# Patient Record
Sex: Female | Born: 1985 | Race: White | Hispanic: No | Marital: Married | Smoking: Never smoker
Health system: Southern US, Community
[De-identification: ages and names within clinical notes are randomized; demographics above are authoritative.]

## PROBLEM LIST (undated history)

## (undated) DIAGNOSIS — T8859XA Other complications of anesthesia, initial encounter: Secondary | ICD-10-CM

## (undated) DIAGNOSIS — K219 Gastro-esophageal reflux disease without esophagitis: Secondary | ICD-10-CM

## (undated) DIAGNOSIS — C801 Malignant (primary) neoplasm, unspecified: Secondary | ICD-10-CM

## (undated) DIAGNOSIS — F419 Anxiety disorder, unspecified: Secondary | ICD-10-CM

## (undated) DIAGNOSIS — Z8489 Family history of other specified conditions: Secondary | ICD-10-CM

## (undated) DIAGNOSIS — F329 Major depressive disorder, single episode, unspecified: Secondary | ICD-10-CM

## (undated) DIAGNOSIS — M549 Dorsalgia, unspecified: Secondary | ICD-10-CM

## (undated) DIAGNOSIS — M1990 Unspecified osteoarthritis, unspecified site: Secondary | ICD-10-CM

## (undated) DIAGNOSIS — I10 Essential (primary) hypertension: Secondary | ICD-10-CM

## (undated) DIAGNOSIS — G2581 Restless legs syndrome: Secondary | ICD-10-CM

## (undated) DIAGNOSIS — M810 Age-related osteoporosis without current pathological fracture: Secondary | ICD-10-CM

## (undated) DIAGNOSIS — F32A Depression, unspecified: Secondary | ICD-10-CM

## (undated) HISTORY — PX: BREAST SURGERY: SHX581

## (undated) HISTORY — PX: LEG SURGERY: SHX1003

## (undated) HISTORY — PX: BACK SURGERY: SHX140

---
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
## 2010-01-02 HISTORY — PX: BACK SURGERY: SHX140

## 2012-01-03 HISTORY — PX: BREAST SURGERY: SHX581

## 2015-11-26 DIAGNOSIS — F419 Anxiety disorder, unspecified: Secondary | ICD-10-CM | POA: Insufficient documentation

## 2017-12-03 ENCOUNTER — Encounter (HOSPITAL_COMMUNITY): Payer: Self-pay | Admitting: *Deleted

## 2017-12-03 ENCOUNTER — Emergency Department (HOSPITAL_COMMUNITY)
Admission: EM | Admit: 2017-12-03 | Discharge: 2017-12-03 | Disposition: A | Discharge status: Home / Self Care | Payer: Medicaid - Out of State | Attending: Emergency Medicine | Admitting: Emergency Medicine

## 2017-12-03 DIAGNOSIS — Y929 Unspecified place or not applicable: Secondary | ICD-10-CM | POA: Insufficient documentation

## 2017-12-03 DIAGNOSIS — S61011A Laceration without foreign body of right thumb without damage to nail, initial encounter: Secondary | ICD-10-CM

## 2017-12-03 DIAGNOSIS — Y93G1 Activity, food preparation and clean up: Secondary | ICD-10-CM | POA: Diagnosis not present

## 2017-12-03 DIAGNOSIS — Z23 Encounter for immunization: Secondary | ICD-10-CM | POA: Diagnosis not present

## 2017-12-03 DIAGNOSIS — W25XXXA Contact with sharp glass, initial encounter: Secondary | ICD-10-CM | POA: Insufficient documentation

## 2017-12-03 DIAGNOSIS — Y999 Unspecified external cause status: Secondary | ICD-10-CM | POA: Insufficient documentation

## 2017-12-03 MED ORDER — LIDOCAINE HCL (PF) 1 % IJ SOLN
5.0000 mL | Freq: Once | INTRAMUSCULAR | Status: AC
  Administered 2017-12-03: 5 mL via INTRADERMAL
  Filled 2017-12-03: qty 5

## 2017-12-03 MED ORDER — TETANUS-DIPHTH-ACELL PERTUSSIS 5-2.5-18.5 LF-MCG/0.5 IM SUSP
0.5000 mL | Freq: Once | INTRAMUSCULAR | Status: AC
  Administered 2017-12-03: 0.5 mL via INTRAMUSCULAR
  Filled 2017-12-03: qty 0.5

## 2017-12-03 NOTE — ED Provider Notes (Signed)
[facility] EMERGENCY DEPARTMENT Provider Note   CSN: [id number] Arrival date & time: 12/03/17  0015     History   Chief Complaint Chief Complaint  Patient presents with  . Laceration    HPI Rachel Wilkerson is a 32 y.o. female.  The history is provided by the patient and medical records.  Laceration       32 y.o. F here with right thumb laceration.  Patient reports she was washing dishes and a glass broke and cut her right thumb.  States bleeding was significant, applied dressing at home and came here.  Bleeding is controlled on arrival.  She is right hand dominant.  Tetanus is UTD.  History reviewed. No pertinent past medical history.  There are no active problems to display for this patient.   History reviewed. No pertinent surgical history.   OB History   None      Home Medications    Prior to Admission medications   Not on File    Family History No family history on file.  Social History Social History   Tobacco Use  . Smoking status: Not on file  Substance Use Topics  . Alcohol use: Not on file  . Drug use: Not on file     Allergies   Erythromycin and Tramadol   Review of Systems Review of Systems  Skin: Positive for wound.  All other systems reviewed and are negative.    Physical Exam Updated Vital Signs BP (!) 176/97 (BP Location: Left Arm)   Pulse 91   Temp 98.2 F (36.8 C) (Oral)   Resp 14   SpO2 98%   Physical Exam  Constitutional: She is oriented to person, place, and time. She appears well-developed and well-nourished.  HENT:  Head: Normocephalic and atraumatic.  Mouth/Throat: Oropharynx is clear and moist.  Eyes: Pupils are equal, round, and reactive to light. Conjunctivae and EOM are normal.  Neck: Normal range of motion.  Cardiovascular: Normal rate, regular rhythm and normal heart sounds.  Pulmonary/Chest: Effort normal and breath sounds normal. No stridor. No respiratory distress.  Abdominal:  Soft. Bowel sounds are normal. There is no tenderness. There is no rebound.  Musculoskeletal: Normal range of motion.  V shaped laceration of dorsal aspect right thumb, bleeding controlled; laceration crossed the IP joint but maintains normal flexion/extension, no deep tissue, vessel, or tendon involvement noted, no bony deformities; normal distal sensation and cap refill  Neurological: She is alert and oriented to person, place, and time.  Skin: Skin is warm and dry.  Psychiatric: She has a normal mood and affect.  Nursing note and vitals reviewed.    ED Treatments / Results  Labs (all labs ordered are listed, but only abnormal results are displayed) Labs Reviewed - No data to display  EKG None  Radiology No results found.  Procedures Procedures (including critical care time)  LACERATION REPAIR Performed by: Larene Pickett Authorized by: Larene Pickett Consent: Verbal consent obtained. Risks and benefits: risks, benefits and alternatives were discussed Consent given by: patient Patient identity confirmed: provided demographic data Prepped and Draped in normal sterile fashion Wound explored  Laceration Location: right thumb  Laceration Length: 4cm, V-shaped  No Foreign Bodies seen or palpated  Anesthesia: local infiltration  Local anesthetic: lidocaine 1% without epinephrine  Anesthetic total: 4 ml  Irrigation method: syringe Amount of cleaning: standard  Skin closure: 4-0 prolene  Number of sutures: 4  Technique: simple interrupted  Patient tolerance: Patient tolerated  the procedure well with no immediate complications.   Medications Ordered in ED Medications  lidocaine (PF) (XYLOCAINE) 1 % injection 5 mL (5 mLs Intradermal Given 12/03/17 0027)  Tdap (BOOSTRIX) injection 0.5 mL (0.5 mLs Intramuscular Given 12/03/17 0026)     Initial Impression / Assessment and Plan / ED Course  I have reviewed the triage vital signs and the nursing notes.  Pertinent  labs & imaging results that were available during my care of the patient were reviewed by me and considered in my medical decision making (see chart for details).  32 y.o. F here with laceration to right thumb which she sustained when glass broke while doing dishes.  Bleeding controlled on arrival.  She has 4 cm V-shaped laceration to right dorsal thumb.  This does cross the IP joint but there is no deep tissue, vessel, or tendon involvement.  Tetanus was updated.  Laceration repaired as above, patient tolerated well.  Discussed home wound care.  Close follow-up with urgent care for suture removal in 7 to 10 days.  She will return here for any new or worsening symptoms.  Final Clinical Impressions(s) / ED Diagnoses   Final diagnoses:  Laceration of right thumb without foreign body without damage to nail, initial encounter    ED Discharge Orders    None       Larene Pickett, PA-C 12/03/17 0224    Orpah Greek, MD 12/03/17 [phone]

## 2017-12-03 NOTE — Discharge Instructions (Signed)
Keep sutures clean and dry. Follow-up with urgent care for suture removal in 7-10 days. Return to the ED for new or worsening symptoms.

## 2017-12-03 NOTE — ED Triage Notes (Signed)
To ED for eval of right thumb lac. Pt was washing dishes and there was a broken glass in the sink. Sensation wnl to thumb. Unknown last tentnus

## 2017-12-08 ENCOUNTER — Emergency Department (HOSPITAL_COMMUNITY)
Admission: EM | Admit: 2017-12-08 | Discharge: 2017-12-08 | Disposition: A | Discharge status: Home / Self Care | Payer: Medicaid - Out of State | Attending: Emergency Medicine | Admitting: Emergency Medicine

## 2017-12-08 ENCOUNTER — Encounter (HOSPITAL_COMMUNITY): Payer: Self-pay | Admitting: Emergency Medicine

## 2017-12-08 ENCOUNTER — Other Ambulatory Visit: Payer: Self-pay

## 2017-12-08 ENCOUNTER — Emergency Department (HOSPITAL_COMMUNITY): Payer: Medicaid - Out of State

## 2017-12-08 DIAGNOSIS — L089 Local infection of the skin and subcutaneous tissue, unspecified: Secondary | ICD-10-CM | POA: Insufficient documentation

## 2017-12-08 DIAGNOSIS — I10 Essential (primary) hypertension: Secondary | ICD-10-CM | POA: Insufficient documentation

## 2017-12-08 DIAGNOSIS — W25XXXD Contact with sharp glass, subsequent encounter: Secondary | ICD-10-CM | POA: Insufficient documentation

## 2017-12-08 DIAGNOSIS — R51 Headache: Secondary | ICD-10-CM | POA: Diagnosis not present

## 2017-12-08 DIAGNOSIS — S61011D Laceration without foreign body of right thumb without damage to nail, subsequent encounter: Secondary | ICD-10-CM | POA: Diagnosis present

## 2017-12-08 DIAGNOSIS — S61011A Laceration without foreign body of right thumb without damage to nail, initial encounter: Secondary | ICD-10-CM

## 2017-12-08 LAB — CBC WITH DIFFERENTIAL/PLATELET
Abs Immature Granulocytes: 0.02 10*3/uL (ref 0.00–0.07)
Basophils Absolute: 0 10*3/uL (ref 0.0–0.1)
Basophils Relative: 0 %
Eosinophils Absolute: 0.1 10*3/uL (ref 0.0–0.5)
Eosinophils Relative: 1 %
HCT: 38.6 % (ref 36.0–46.0)
Hemoglobin: 12.6 g/dL (ref 12.0–15.0)
Immature Granulocytes: 0 %
Lymphocytes Relative: 26 %
Lymphs Abs: 2.6 10*3/uL (ref 0.7–4.0)
MCH: 27.5 pg (ref 26.0–34.0)
MCHC: 32.6 g/dL (ref 30.0–36.0)
MCV: 84.1 fL (ref 80.0–100.0)
Monocytes Absolute: 0.7 10*3/uL (ref 0.1–1.0)
Monocytes Relative: 7 %
Neutro Abs: 6.6 10*3/uL (ref 1.7–7.7)
Neutrophils Relative %: 66 %
Platelets: 329 10*3/uL (ref 150–400)
RBC: 4.59 MIL/uL (ref 3.87–5.11)
RDW: 13 % (ref 11.5–15.5)
WBC: 10 10*3/uL (ref 4.0–10.5)
nRBC: 0 % (ref 0.0–0.2)

## 2017-12-08 LAB — HCG, QUANTITATIVE, PREGNANCY: hCG, Beta Chain, Quant, S: <1 m[IU]/mL (ref <5)

## 2017-12-08 LAB — BASIC METABOLIC PANEL
Anion gap: 11 (ref 5–15)
BUN: 15 mg/dL (ref 6–20)
CO2: 24 mmol/L (ref 22–32)
Calcium: 9.4 mg/dL (ref 8.9–10.3)
Chloride: 100 mmol/L (ref 98–111)
Creatinine, Ser: 1.1 mg/dL — ABNORMAL HIGH (ref 0.44–1.00)
GFR calc Af Amer: >60 mL/min (ref >60)
GFR calc non Af Amer: >60 mL/min (ref >60)
Glucose, Bld: 95 mg/dL (ref 70–99)
Potassium: 3.7 mmol/L (ref 3.5–5.1)
Sodium: 135 mmol/L (ref 135–145)

## 2017-12-08 MED ORDER — KETOROLAC TROMETHAMINE 30 MG/ML IJ SOLN
30.0000 mg | Freq: Once | INTRAMUSCULAR | Status: AC
  Administered 2017-12-08: 30 mg via INTRAVENOUS
  Filled 2017-12-08: qty 1

## 2017-12-08 MED ORDER — CEPHALEXIN 250 MG PO CAPS
500.0000 mg | ORAL_CAPSULE | Freq: Once | ORAL | Status: AC
  Administered 2017-12-08: 500 mg via ORAL
  Filled 2017-12-08: qty 2

## 2017-12-08 MED ORDER — DIPHENHYDRAMINE HCL 50 MG/ML IJ SOLN
25.0000 mg | Freq: Once | INTRAMUSCULAR | Status: AC
  Administered 2017-12-08: 25 mg via INTRAVENOUS
  Filled 2017-12-08: qty 1

## 2017-12-08 MED ORDER — PROCHLORPERAZINE EDISYLATE 10 MG/2ML IJ SOLN
10.0000 mg | Freq: Once | INTRAMUSCULAR | Status: AC
  Administered 2017-12-08: 10 mg via INTRAVENOUS
  Filled 2017-12-08: qty 2

## 2017-12-08 MED ORDER — SODIUM CHLORIDE 0.9 % IV BOLUS
1000.0000 mL | Freq: Once | INTRAVENOUS | Status: AC
  Administered 2017-12-08: 1000 mL via INTRAVENOUS

## 2017-12-08 MED ORDER — CEPHALEXIN 500 MG PO CAPS: 500.0000 mg | ORAL_CAPSULE | Freq: Four times a day (QID) | ORAL | 0 refills | Status: AC

## 2017-12-08 NOTE — Discharge Instructions (Addendum)
Please take all of your antibiotics until finished!   You may develop abdominal discomfort or diarrhea from the antibiotic.  You may help offset this with probiotics which you can buy or get in yogurt. Do not eat  or take the probiotics until 2 hours after your antibiotic.   Alternate 600 mg of ibuprofen and [phone] mg of Tylenol every 3-4 hours as needed for pain. Do not exceed 4000 mg of Tylenol daily.  Take ibuprofen with food to avoid upset stomach issues.  Keep your wound clean and dry.  Change the dressing once daily.  Follow-up with hand surgery for reevaluation of your symptoms and wound check in the next 48 hours.  If you are unable to get an appointment with a hand surgeon for reevaluation in 48 hours you can go to the urgent care or return to the ED for wound check.  If your blood pressure (BP) was elevated on multiple readings during this visit above 130 for the top number or above 80 for the bottom number, please have this repeated by your primary care provider within one month. You can also check your blood pressure when you are out at a pharmacy or grocery store. Many have machines that will check your blood pressure.  If your blood pressure remains elevated, please follow-up with your PCP.  Return to the emergency department sooner if any concerning signs or symptoms develop such as fevers, streaking of redness up the arm, severe swelling, vomiting, altered mental status, or passing out.

## 2017-12-08 NOTE — ED Provider Notes (Signed)
[facility] EMERGENCY DEPARTMENT Provider Note   CSN: [id number] Arrival date & time: 12/08/17  1338     History   Chief Complaint Chief Complaint  Patient presents with  . thumb infection    HPI Rachel Wilkerson is a 32 y.o. female with history of hypertension and migraines presents for evaluation of acute onset, progressively worsening erythema and drainage from a wound beginning 2 days ago.  She was seen and evaluated in the ED earlier on 12/03/2017 for a right thumb wound sustained earlier the day before washing dishes and a glass broke.  She was neurovascularly intact the wound was thoroughly irrigated and repaired which she tolerated without difficulty.  She reports that the wound had been healing "just fine" up until she did dishes 2 days ago.  She reports she woke up yesterday with erythema and swelling and when she bent her right thumb she "busted open 1 of the sutures and some yellowish-white fluid came out ".  She reports that the erythema has spread since then.  She denies fevers, diaphoresis, or chills.  She does not believe there is any retained glass.  She does note a small patch of numbness distal to the laceration repair and some tingling proximal to the laceration repair but otherwise has intact sensation.  When asked about her blood pressure, she reports that she has a known history of hypertension and takes metoprolol for this every night.  She reports that when she is compliant with her medications her blood pressure is "pretty well controlled ".  She has not been checking her blood pressure recently but does note that she has had increasing severity of her usual migraine headaches this week.  She reports that she has had a headache almost every day, localized to the frontal region and throbbing.  She does note associated photophobia and phonophobia and nausea.  She denies vision changes, slurred speech, syncope, chest pain, shortness of breath, or abdominal  pain.  Denies head injury.  The history is provided by the patient.    History reviewed. No pertinent past medical history.  There are no active problems to display for this patient.   History reviewed. No pertinent surgical history.   OB History   None      Home Medications    Prior to Admission medications   Medication Sig Start Date End Date Taking? Authorizing Provider  cephALEXin (KEFLEX) 500 MG capsule Take 1 capsule (500 mg total) by mouth 4 (four) times daily for 7 days. 12/08/17 12/15/17  Renita Papa, PA-C    Family History No family history on file.  Social History Social History   Tobacco Use  . Smoking status: Not on file  Substance Use Topics  . Alcohol use: Not on file  . Drug use: Not on file     Allergies   Erythromycin and Tramadol   Review of Systems Review of Systems  Constitutional: Negative for chills and fever.  Respiratory: Negative for shortness of breath.   Cardiovascular: Negative for chest pain.  Gastrointestinal: Positive for nausea. Negative for abdominal pain and vomiting.  Skin: Positive for color change and wound.  Neurological: Positive for headaches. Negative for syncope, weakness and numbness.  All other systems reviewed and are negative.    Physical Exam Updated Vital Signs BP (!) 153/88 (BP Location: Left Arm)   Pulse 92   Temp 98.3 F (36.8 C) (Oral)   Resp 20   SpO2 100%   Physical Exam  Constitutional: She appears well-developed and well-nourished. No distress.  HENT:  Head: Normocephalic and atraumatic.  Eyes: Conjunctivae are normal. Right eye exhibits no discharge. Left eye exhibits no discharge.  Neck: No JVD present. No tracheal deviation present.  Cardiovascular: Normal rate, regular rhythm and intact distal pulses.  Pulmonary/Chest: Effort normal and breath sounds normal.  Abdominal: Soft. Bowel sounds are normal. She exhibits no distension. There is no tenderness. There is no guarding.    Musculoskeletal: Normal range of motion. She exhibits no edema.  See below image.  Wound to the radial aspect of the right thumb, 4 imple interrupted sutures still in place.  Oozing purulent fluid.  There is some surrounding erythema and mild tenderness to palpation.  Normal active range of motion.  5/5 strength of BUE major muscle groups.  No crepitus.  Neurological: She is alert.  Mental Status:  Alert, thought content appropriate, able to give a coherent history. Speech fluent without evidence of aphasia. Able to follow 2 step commands without difficulty.  Cranial Nerves:  II:  Peripheral visual fields grossly normal, pupils equal, round, reactive to light III,IV, VI: ptosis not present, extra-ocular motions intact bilaterally  V,VII: smile symmetric, facial light touch sensation equal VIII: hearing grossly normal to voice  X: uvula elevates symmetrically  XI: bilateral shoulder shrug symmetric and strong XII: midline tongue extension without fassiculations Motor:  Normal tone. 5/5 strength of BUE and BLE major muscle groups including strong and equal grip strength and dorsiflexion/plantar flexion Sensory: light touch normal in all extremities. Cerebellar: normal finger-to-nose with bilateral upper extremities Gait: normal gait and balance. Able to walk on toes and heels with ease.  Romberg sign absent.  No pronator drift.  No nystagmus per  Skin: Skin is warm and dry. Capillary refill takes less than 2 seconds. There is erythema.  Psychiatric: She has a normal mood and affect. Her behavior is normal.  Nursing note and vitals reviewed.      ED Treatments / Results  Labs (all labs ordered are listed, but only abnormal results are displayed) Labs Reviewed  CBC WITH DIFFERENTIAL/PLATELET  BASIC METABOLIC PANEL  HCG, QUANTITATIVE, PREGNANCY    EKG None  Radiology Ct Head Wo Contrast  Result Date: 12/08/2017 CLINICAL DATA:  31 year old female with diffuse headache,  hypertension and a history of Hodgkin's lymphoma EXAM: CT HEAD WITHOUT CONTRAST TECHNIQUE: Contiguous axial images were obtained from the base of the skull through the vertex without intravenous contrast. COMPARISON:  None. FINDINGS: Brain: No evidence of acute infarction, hemorrhage, hydrocephalus, extra-axial collection or mass lesion/mass effect. Vascular: No hyperdense vessel or unexpected calcification. Skull: Normal. Negative for fracture or focal lesion. Sinuses/Orbits: No acute finding. Other: None. IMPRESSION: Negative head CT. Electronically Signed   By: Jacqulynn Cadet M.D.   On: 12/08/2017 14:44    Procedures .Suture Removal Date/Time: 12/08/2017 3:12 PM Performed by: Renita Papa, PA-C Authorized by: Renita Papa, PA-C   Consent:    Consent obtained:  Verbal   Consent given by:  Patient   Risks discussed:  Bleeding, pain and wound separation   Alternatives discussed:  No treatment and delayed treatment Location:    Location:  Upper extremity   Upper extremity location:  Hand   Hand location:  R thumb Procedure details:    Wound appearance:  Red, draining and tender   Drainage characteristics:  Purrulent    Number of sutures removed:  4 Post-procedure details:    Post-removal:  Dressing applied   Patient tolerance  of procedure:  Tolerated well, no immediate complications Comments:     Splint for protection.    (including critical care time)  Medications Ordered in ED Medications  cephALEXin (KEFLEX) capsule 500 mg (has no administration in time range)  sodium chloride 0.9 % bolus 1,000 mL (1,000 mLs Intravenous New Bag/Given 12/08/17 1444)  prochlorperazine (COMPAZINE) injection 10 mg (10 mg Intravenous Given 12/08/17 1445)  diphenhydrAMINE (BENADRYL) injection 25 mg (25 mg Intravenous Given 12/08/17 1445)  ketorolac (TORADOL) 30 MG/ML injection 30 mg (30 mg Intravenous Given 12/08/17 1445)     Initial Impression / Assessment and Plan / ED Course  I have reviewed  the triage vital signs and the nursing notes.  Pertinent labs & imaging results that were available during my care of the patient were reviewed by me and considered in my medical decision making (see chart for details).     Patient presenting for evaluation of infection to laceration sustained 6 days ago.  Patient is afebrile, hypertensive in the ED.  She was also hypertensive at her visit on 12/03/2017.  She reports she has been compliant with her blood pressure medications but has been having increasing severity of her usual migraine headaches this week.  No focal neurologic deficits on examination.  Will obtain lab work and head CT to rule out endorgan damage, CVA, or hypertensive emergency.  We will give migraine cocktail for management of symptoms.  With regards to her right thumb laceration, there does appear to be signs of infection.  We discussed the risks and benefits of suture removal and she wished to proceed with suture removal at this time.  There is mild dehiscence after removal.  The wound was cleaned and a splint for protection was applied.  We will obtain radiographs to rule out subcutaneous gas, retained foreign body, or osseous abnormality.  She is neurovascularly intact, no evidence of tendon disruption or ligamentous injury.  She is nontoxic and nonseptic in appearance.  3:14 PM Head CT without acute intracranial abnormality.  Patient was just given the migraine cocktail.  Signed out to oncoming provider PA Percell Miller.  Awaiting x-ray and reassessment.  If patient's headache and blood pressure have improved and there is no evidence of concerning abnormality on x-ray she will be stable for discharge home with follow-up with PCP for reevaluation of hypertension and possible medication management.  Recommend follow-up with hand surgery in the next 2 days for reassessment.  Will discharge with a course of Keflex.  She is unable to follow-up with the hand surgeon in the next 48 hours, she can  return to the ED or urgent care for wound check.  I have discussed indications for return to the ED and the patient and her daughter have verbalized understanding of and agreement with the preemptive plan barring any areas to safe discharge.  Final Clinical Impressions(s) / ED Diagnoses   Final diagnoses:  Laceration of right thumb with infection, initial encounter  Hypertension, unspecified type    ED Discharge Orders         Ordered    cephALEXin (KEFLEX) 500 MG capsule  4 times daily     12/08/17 1517           Renita Papa, PA-C 12/08/17 1517    Dorie Rank, MD 12/08/17 1902

## 2017-12-08 NOTE — ED Notes (Signed)
Patient verbalizes understanding of discharge instructions. Opportunity for questioning and answers were provided. 

## 2017-12-08 NOTE — ED Provider Notes (Signed)
Assumed care of patient at change of shift. Briefly, laceration to the thumb 12/03/17, closed with sutures. CT head and labs due to elevated blood pressure and headache, CT head normal, labs pending. Patient to wound check in 2 days with ER/UC or hand.  Physical Exam  BP (!) 153/88 (BP Location: Left Arm)   Pulse 92   Temp 98.3 F (36.8 C) (Oral)   Resp 20   SpO2 100%   Physical Exam  ED Course/Procedures     Procedures  MDM  XR thumb unremarkable, labs WNL. Patient is feeling better and ready for dc home.        Tacy Learn, PA-C 12/08/17 1613    Dorie Rank, MD 12/08/17 [phone]

## 2017-12-08 NOTE — ED Triage Notes (Signed)
Pt was here 12/2 for thumb laceration and had stitches. 2 days ago pt washed dishes and now the suture sight is red with drainage coming out. Pt complaining of throbbing pain at sight and on hand

## 2017-12-17 ENCOUNTER — Encounter (HOSPITAL_COMMUNITY): Payer: Self-pay

## 2017-12-17 ENCOUNTER — Emergency Department (HOSPITAL_COMMUNITY)
Admission: EM | Admit: 2017-12-17 | Discharge: 2017-12-18 | Disposition: A | Discharge status: Home / Self Care | Payer: Medicaid - Out of State | Attending: Emergency Medicine | Admitting: Emergency Medicine

## 2017-12-17 ENCOUNTER — Other Ambulatory Visit: Payer: Self-pay

## 2017-12-17 DIAGNOSIS — Z5321 Procedure and treatment not carried out due to patient leaving prior to being seen by health care provider: Secondary | ICD-10-CM | POA: Insufficient documentation

## 2017-12-17 DIAGNOSIS — R51 Headache: Secondary | ICD-10-CM | POA: Diagnosis present

## 2017-12-17 HISTORY — DX: Dorsalgia, unspecified: M54.9

## 2017-12-17 HISTORY — DX: Major depressive disorder, single episode, unspecified: F32.9

## 2017-12-17 HISTORY — DX: Essential (primary) hypertension: I10

## 2017-12-17 HISTORY — DX: Depression, unspecified: F32.A

## 2017-12-17 HISTORY — DX: Anxiety disorder, unspecified: F41.9

## 2017-12-17 HISTORY — DX: Malignant (primary) neoplasm, unspecified: C80.1

## 2017-12-17 HISTORY — DX: Gastro-esophageal reflux disease without esophagitis: K21.9

## 2017-12-17 LAB — I-STAT BETA HCG BLOOD, ED (MC, WL, AP ONLY): I-stat hCG, quantitative: <5 m[IU]/mL (ref <5)

## 2017-12-17 LAB — COMPREHENSIVE METABOLIC PANEL
ALT: 27 U/L (ref 0–44)
AST: 19 U/L (ref 15–41)
Albumin: 4.2 g/dL (ref 3.5–5.0)
Alkaline Phosphatase: 52 U/L (ref 38–126)
Anion gap: 11 (ref 5–15)
BUN: 11 mg/dL (ref 6–20)
CALCIUM: 9.7 mg/dL (ref 8.9–10.3)
CO2: 26 mmol/L (ref 22–32)
CREATININE: 1.02 mg/dL — AB (ref 0.44–1.00)
Chloride: 101 mmol/L (ref 98–111)
GFR calc Af Amer: >60 mL/min (ref >60)
GFR calc non Af Amer: >60 mL/min (ref >60)
Glucose, Bld: 100 mg/dL — ABNORMAL HIGH (ref 70–99)
Potassium: 3.7 mmol/L (ref 3.5–5.1)
Sodium: 138 mmol/L (ref 135–145)
Total Bilirubin: 0.9 mg/dL (ref 0.3–1.2)
Total Protein: 7.7 g/dL (ref 6.5–8.1)

## 2017-12-17 LAB — CBC
HCT: 36.3 % (ref 36.0–46.0)
Hemoglobin: 12.1 g/dL (ref 12.0–15.0)
MCH: 27.9 pg (ref 26.0–34.0)
MCHC: 33.3 g/dL (ref 30.0–36.0)
MCV: 83.6 fL (ref 80.0–100.0)
PLATELETS: 284 10*3/uL (ref 150–400)
RBC: 4.34 MIL/uL (ref 3.87–5.11)
RDW: 13 % (ref 11.5–15.5)
WBC: 10.4 10*3/uL (ref 4.0–10.5)
nRBC: 0 % (ref 0.0–0.2)

## 2017-12-17 LAB — LIPASE, BLOOD: Lipase: 28 U/L (ref 11–51)

## 2017-12-17 MED ORDER — ONDANSETRON 4 MG PO TBDP
4.0000 mg | ORAL_TABLET | Freq: Once | ORAL | Status: AC | PRN
  Administered 2017-12-17: 4 mg via ORAL
  Filled 2017-12-17: qty 1

## 2017-12-17 MED ORDER — ACETAMINOPHEN 325 MG PO TABS
650.0000 mg | ORAL_TABLET | Freq: Once | ORAL | Status: AC | PRN
  Administered 2017-12-17: 650 mg via ORAL
  Filled 2017-12-17: qty 2

## 2017-12-17 NOTE — ED Triage Notes (Signed)
Pt reports headache for the past 2 weeks that has gotten worse with dizziness that started on the way here. Pt now c/o nausea that started today, no vomiting.

## 2017-12-18 ENCOUNTER — Emergency Department (HOSPITAL_COMMUNITY)
Admission: EM | Admit: 2017-12-18 | Discharge: 2017-12-18 | Disposition: A | Discharge status: Home / Self Care | Payer: Medicaid - Out of State | Source: Home / Self Care

## 2017-12-18 DIAGNOSIS — R079 Chest pain, unspecified: Secondary | ICD-10-CM

## 2017-12-18 DIAGNOSIS — Z5321 Procedure and treatment not carried out due to patient leaving prior to being seen by health care provider: Secondary | ICD-10-CM | POA: Insufficient documentation

## 2017-12-18 NOTE — ED Triage Notes (Signed)
Pt left AMA, called EMS from home w/ family.  Hx of HNT (off medications), cancer.  Per EMS, called for headache, chest pain w/ deep breath, clear lung sounds.  Emesis w/ EMS.  Gave 3 nitro, 324 ASA, 4 Zofran. 20 G in Left wrist.  Sinus Tac at 133.  CBG 102.    Note: AS RN was getting report from EMS, pt walked out of the triage room telling NT that she was leaving b/c she didn't want to wait.

## 2018-01-13 ENCOUNTER — Encounter (HOSPITAL_COMMUNITY): Payer: Self-pay | Admitting: Emergency Medicine

## 2018-01-13 ENCOUNTER — Other Ambulatory Visit: Payer: Self-pay

## 2018-01-13 ENCOUNTER — Emergency Department (HOSPITAL_COMMUNITY)
Admission: EM | Admit: 2018-01-13 | Discharge: 2018-01-13 | Disposition: A | Discharge status: Home / Self Care | Payer: Medicaid - Out of State | Attending: Emergency Medicine | Admitting: Emergency Medicine

## 2018-01-13 DIAGNOSIS — Z9119 Patient's noncompliance with other medical treatment and regimen: Secondary | ICD-10-CM | POA: Diagnosis not present

## 2018-01-13 DIAGNOSIS — I10 Essential (primary) hypertension: Secondary | ICD-10-CM | POA: Diagnosis not present

## 2018-01-13 DIAGNOSIS — R0981 Nasal congestion: Secondary | ICD-10-CM | POA: Diagnosis present

## 2018-01-13 DIAGNOSIS — H6121 Impacted cerumen, right ear: Secondary | ICD-10-CM | POA: Insufficient documentation

## 2018-01-13 DIAGNOSIS — Z8571 Personal history of Hodgkin lymphoma: Secondary | ICD-10-CM | POA: Insufficient documentation

## 2018-01-13 DIAGNOSIS — J0100 Acute maxillary sinusitis, unspecified: Secondary | ICD-10-CM | POA: Diagnosis not present

## 2018-01-13 MED ORDER — ATENOLOL 25 MG PO TABS: 25.0000 mg | ORAL_TABLET | Freq: Every day | ORAL | 0 refills | Status: DC

## 2018-01-13 MED ORDER — AMOXICILLIN-POT CLAVULANATE 875-125 MG PO TABS: 1.0000 | ORAL_TABLET | Freq: Two times a day (BID) | ORAL | 0 refills | Status: DC

## 2018-01-13 NOTE — ED Provider Notes (Signed)
[facility] Provider Note   CSN: [id number] Arrival date & time: 01/13/18  1600     History   Chief Complaint Chief Complaint  Patient presents with  . Facial Pain  . Nasal Congestion    HPI Rachel Wilkerson is a 33 y.o. female who presents with sinus congestion.  Past medical history significant for chronic back pain, history of Hodgkin's lymphoma currently in remission, anxiety/depression, GERD, hypertension.  Patient states that she has had sinus congestion for the past week and a half.  She also has a pressure behind her eyes and a headache.  She has been taking Tylenol cold and sinus and using Flonase without significant relief.  She also notes that she has been off her blood pressure medications for several weeks.  She is not able to get a refill until 23 January and her primary care provider is in [place].  She denies fever, ear pain, runny nose, sore throat, chest pain, shortness of breath, palpitations, lightheadedness or syncope.  HPI  Past Medical History:  Diagnosis Date  . Anxiety   . Back pain   . Cancer ([facility])    hodkings lymphoma 2016  . Depression   . GERD (gastroesophageal reflux disease)   . Hypertension     There are no active problems to display for this patient.   Past Surgical History:  Procedure Laterality Date  . BACK SURGERY    . BREAST SURGERY    . LEG SURGERY       OB History   No obstetric history on file.      Home Medications    Prior to Admission medications   Medication Sig Start Date End Date Taking? Authorizing Provider  amoxicillin-clavulanate (AUGMENTIN) 875-125 MG tablet Take 1 tablet by mouth 2 (two) times daily. 01/13/18   Recardo Evangelist, PA-C  atenolol (TENORMIN) 25 MG tablet Take 1 tablet (25 mg total) by mouth daily. 01/13/18   Recardo Evangelist, PA-C    Family History No family history on file.  Social History Social History   Tobacco Use  . Smoking status: Never  Smoker  . Smokeless tobacco: Never Used  Substance Use Topics  . Alcohol use: Not Currently  . Drug use: Never     Allergies   Erythromycin and Tramadol   Review of Systems Review of Systems  Constitutional: Negative for chills and fever.  HENT: Positive for congestion and sinus pressure. Negative for ear pain, rhinorrhea and sore throat.   Respiratory: Negative for shortness of breath.   Cardiovascular: Negative for chest pain.  Gastrointestinal: Negative for abdominal pain.  Neurological: Positive for headaches. Negative for dizziness and syncope.  All other systems reviewed and are negative.    Physical Exam Updated Vital Signs BP (!) 156/114 (BP Location: Right Arm)   Pulse (!) 104   Temp 98.5 F (36.9 C) (Oral)   Resp 16   Ht 5\' 4"  (1.626 m)   Wt 88 kg   LMP 01/06/2018   SpO2 100%   BMI 33.30 kg/m   Physical Exam Vitals signs and nursing note reviewed.  Constitutional:      General: She is not in acute distress.    Appearance: Normal appearance. She is well-developed. She is obese.     Comments: Calm, cooperative.  Sounds congested.  HENT:     Head: Normocephalic and atraumatic.     Right Ear: There is impacted cerumen.     Left Ear: Hearing, tympanic membrane,  ear canal and external ear normal.     Nose:     Right Sinus: Maxillary sinus tenderness and frontal sinus tenderness present.     Left Sinus: Maxillary sinus tenderness and frontal sinus tenderness present.     Mouth/Throat:     Lips: Pink.     Mouth: Mucous membranes are moist.     Pharynx: Oropharynx is clear.  Eyes:     General: No scleral icterus.       Right eye: No discharge.        Left eye: No discharge.     Conjunctiva/sclera: Conjunctivae normal.     Pupils: Pupils are equal, round, and reactive to light.  Neck:     Musculoskeletal: Normal range of motion.  Cardiovascular:     Rate and Rhythm: Tachycardia present.  Pulmonary:     Effort: Pulmonary effort is normal. No  respiratory distress.     Breath sounds: Normal breath sounds.  Abdominal:     General: There is no distension.  Skin:    General: Skin is warm and dry.  Neurological:     Mental Status: She is alert and oriented to person, place, and time.  Psychiatric:        Behavior: Behavior normal.      ED Treatments / Results  Labs (all labs ordered are listed, but only abnormal results are displayed) Labs Reviewed - No data to display  EKG None  Radiology No results found.  Procedures Procedures (including critical care time)  Medications Ordered in ED Medications - No data to display   Initial Impression / Assessment and Plan / ED Course  I have reviewed the triage vital signs and the nursing notes.  Pertinent labs & imaging results that were available during my care of the patient were reviewed by me and considered in my medical decision making (see chart for details).  33 year old female presents with sinus congestion and a headache.  She notes that she has been off blood pressure medicine for several weeks because she has not been able to follow-up with her PCP was in [place].  She is requesting a refill of her blood pressure medicine today.  Although patient is overall well-appearing her symptoms have been ongoing for over a week therefore I think antibiotic therapy is indicated.  Will scribe Augmentin for 5 days and have her continue over-the-counter medicines.  She was encouraged to establish care with a primary care provider in the area.  Final Clinical Impressions(s) / ED Diagnoses   Final diagnoses:  Acute non-recurrent maxillary sinusitis  Hypertension, unspecified type    ED Discharge Orders         Ordered    amoxicillin-clavulanate (AUGMENTIN) 875-125 MG tablet  2 times daily     01/13/18 1708    atenolol (TENORMIN) 25 MG tablet  Daily     01/13/18 1708           Recardo Evangelist, PA-C 01/13/18 1734    Maudie Flakes, MD 01/14/18 1034

## 2018-01-13 NOTE — ED Notes (Signed)
Patient verbalizes understanding of discharge instructions. Opportunity for questioning and answers were provided. Armband removed by staff, pt discharged from ED.  

## 2018-01-13 NOTE — ED Triage Notes (Signed)
Patient complaints of with complaints of left eye, bilateral pressure and congestion for 1 week. Reports headache for 2 weeks. Denies history of sinus issues. Reports history of migraine and high blood pressure. Denies shortness. Denies sore throat or fever.

## 2018-01-13 NOTE — Discharge Instructions (Signed)
Take Augmentin twice a day for 5 days. Take with food Continue Tylenol for pain and Flonase as a decongestant  Re-start Atenolol Please establish care with a primary doctor

## 2018-05-14 ENCOUNTER — Emergency Department (HOSPITAL_COMMUNITY)
Admission: EM | Admit: 2018-05-14 | Discharge: 2018-05-14 | Disposition: A | Discharge status: Home / Self Care | Payer: Medicaid - Out of State | Attending: Emergency Medicine | Admitting: Emergency Medicine

## 2018-05-14 ENCOUNTER — Emergency Department (HOSPITAL_COMMUNITY): Payer: Medicaid - Out of State

## 2018-05-14 ENCOUNTER — Other Ambulatory Visit: Payer: Self-pay

## 2018-05-14 DIAGNOSIS — Z8571 Personal history of Hodgkin lymphoma: Secondary | ICD-10-CM | POA: Insufficient documentation

## 2018-05-14 DIAGNOSIS — M549 Dorsalgia, unspecified: Secondary | ICD-10-CM

## 2018-05-14 DIAGNOSIS — M5441 Lumbago with sciatica, right side: Secondary | ICD-10-CM | POA: Diagnosis not present

## 2018-05-14 DIAGNOSIS — R634 Abnormal weight loss: Secondary | ICD-10-CM | POA: Diagnosis not present

## 2018-05-14 DIAGNOSIS — M545 Low back pain: Secondary | ICD-10-CM | POA: Diagnosis present

## 2018-05-14 DIAGNOSIS — Z79899 Other long term (current) drug therapy: Secondary | ICD-10-CM | POA: Insufficient documentation

## 2018-05-14 DIAGNOSIS — R61 Generalized hyperhidrosis: Secondary | ICD-10-CM | POA: Insufficient documentation

## 2018-05-14 DIAGNOSIS — R350 Frequency of micturition: Secondary | ICD-10-CM | POA: Insufficient documentation

## 2018-05-14 DIAGNOSIS — I10 Essential (primary) hypertension: Secondary | ICD-10-CM | POA: Diagnosis not present

## 2018-05-14 LAB — URINALYSIS, ROUTINE W REFLEX MICROSCOPIC
Bilirubin Urine: NEGATIVE
Glucose, UA: NEGATIVE mg/dL
Hgb urine dipstick: NEGATIVE
Ketones, ur: NEGATIVE mg/dL
Leukocytes,Ua: NEGATIVE
Nitrite: NEGATIVE
Protein, ur: NEGATIVE mg/dL
Specific Gravity, Urine: 1.013 (ref 1.005–1.030)
pH: 5 (ref 5.0–8.0)

## 2018-05-14 LAB — CBC WITH DIFFERENTIAL/PLATELET
Abs Immature Granulocytes: 0.05 10*3/uL (ref 0.00–0.07)
Basophils Absolute: 0 10*3/uL (ref 0.0–0.1)
Basophils Relative: 1 %
Eosinophils Absolute: 0.1 10*3/uL (ref 0.0–0.5)
Eosinophils Relative: 2 %
HCT: 36.9 % (ref 36.0–46.0)
Hemoglobin: 12.4 g/dL (ref 12.0–15.0)
Immature Granulocytes: 1 %
Lymphocytes Relative: 33 %
Lymphs Abs: 2.7 10*3/uL (ref 0.7–4.0)
MCH: 28.5 pg (ref 26.0–34.0)
MCHC: 33.6 g/dL (ref 30.0–36.0)
MCV: 84.8 fL (ref 80.0–100.0)
Monocytes Absolute: 0.6 10*3/uL (ref 0.1–1.0)
Monocytes Relative: 8 %
Neutro Abs: 4.7 10*3/uL (ref 1.7–7.7)
Neutrophils Relative %: 55 %
Platelets: 254 10*3/uL (ref 150–400)
RBC: 4.35 MIL/uL (ref 3.87–5.11)
RDW: 12 % (ref 11.5–15.5)
WBC: 8.3 10*3/uL (ref 4.0–10.5)
nRBC: 0 % (ref 0.0–0.2)

## 2018-05-14 LAB — BASIC METABOLIC PANEL
Anion gap: 10 (ref 5–15)
BUN: 8 mg/dL (ref 6–20)
CO2: 27 mmol/L (ref 22–32)
Calcium: 9.1 mg/dL (ref 8.9–10.3)
Chloride: 100 mmol/L (ref 98–111)
Creatinine, Ser: 0.98 mg/dL (ref 0.44–1.00)
GFR calc Af Amer: >60 mL/min (ref >60)
GFR calc non Af Amer: >60 mL/min (ref >60)
Glucose, Bld: 132 mg/dL — ABNORMAL HIGH (ref 70–99)
Potassium: 4.2 mmol/L (ref 3.5–5.1)
Sodium: 137 mmol/L (ref 135–145)

## 2018-05-14 LAB — I-STAT BETA HCG BLOOD, ED (MC, WL, AP ONLY): I-stat hCG, quantitative: <5 m[IU]/mL (ref <5)

## 2018-05-14 MED ORDER — PREDNISONE 10 MG (21) PO TBPK: ORAL_TABLET | Freq: Every day | ORAL | 0 refills | Status: DC

## 2018-05-14 MED ORDER — METHOCARBAMOL 500 MG PO TABS: 500.0000 mg | ORAL_TABLET | Freq: Two times a day (BID) | ORAL | 0 refills | Status: DC | PRN

## 2018-05-14 MED ORDER — KETOROLAC TROMETHAMINE 30 MG/ML IJ SOLN
30.0000 mg | Freq: Once | INTRAMUSCULAR | Status: AC
  Administered 2018-05-14: 17:00:00 30 mg via INTRAVENOUS
  Filled 2018-05-14: qty 1

## 2018-05-14 MED ORDER — LIDOCAINE 5 % EX PTCH
1.0000 | MEDICATED_PATCH | CUTANEOUS | Status: DC
  Administered 2018-05-14: 17:00:00 1 via TRANSDERMAL
  Filled 2018-05-14: qty 1

## 2018-05-14 MED ORDER — ONDANSETRON 4 MG PO TBDP
4.0000 mg | ORAL_TABLET | Freq: Once | ORAL | Status: AC
  Administered 2018-05-14: 14:00:00 4 mg via ORAL
  Filled 2018-05-14: qty 1

## 2018-05-14 MED ORDER — IOHEXOL 300 MG/ML  SOLN
100.0000 mL | Freq: Once | INTRAMUSCULAR | Status: AC | PRN
  Administered 2018-05-14: 16:00:00 100 mL via INTRAVENOUS

## 2018-05-14 MED ORDER — LIDOCAINE 5 % EX PTCH: 1.0000 | MEDICATED_PATCH | CUTANEOUS | 0 refills | Status: DC

## 2018-05-14 MED ORDER — HYDROCODONE-ACETAMINOPHEN 5-325 MG PO TABS
1.0000 | ORAL_TABLET | Freq: Once | ORAL | Status: AC
  Administered 2018-05-14: 1 via ORAL
  Filled 2018-05-14: qty 1

## 2018-05-14 NOTE — Discharge Instructions (Addendum)
1. Medications: Take steroid taper as prescribed with food to avoid upset stomach issues.  Do not take ibuprofen, Advil, Aleve, or Motrin while taking this medicine.  You may take [phone] mg of Tylenol every 6 hours as needed for pain. Do not exceed 4000 mg of Tylenol daily.  When you have completed the steroid taper you can take your choice of NSAID as prescribed and alternate with Tylenol.  You can take Robaxin (methocarbamol) as needed for muscle relaxation but this medication may make you drowsy so do not drive, drink alcohol, operate heavy machinery, or make important decisions while you are using this medicine.  I typically recommend only taking this medicine at night.  You can also cut these tablets in half if they are very strong.  Apply lidocaine patches to areas of soreness as prescribed. 2. Treatment: rest; drink plenty of fluids; apply ice or heat, whichever feels best, 20 minutes at a time 3-4 times daily; gentle stretching (see attached exercises, but you can also YouTube search low back/lumbar physical therapy exercises) at least 3 times a week.   3. 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

## 2018-05-14 NOTE — ED Triage Notes (Signed)
Hx of L5L6 bac k surgery 5 years ago with new c/o pain and "knot" to R side  of scars.  States prior to arrival urine was a "greenish " color, with no other UTI sx.   Some nausea noted.  Has used Ibu without relief.

## 2018-05-14 NOTE — ED Notes (Signed)
Patient verbalizes understanding of discharge instructions . Opportunity for questions and answers were provided . Armband removed by staff ,Pt discharged from ED. W/C  offered at D/C  and Declined W/C at D/C and was escorted to lobby by RN.  

## 2018-05-14 NOTE — ED Provider Notes (Signed)
Care handoff received from [facility] PA-C at shift change please see her note for further details of visit.  In short 33 year old female with history of non-Hodgkin's lymphoma remission x3 years presents today with low back pain, felt to be midline and concern due to history of cancer CT abdomen pelvis and lumbar spine were ordered.  Lab work unremarkable.  Plan of care at discharge was pending nonacute CT scans discharge with PCP and neurosurgery follow-up. Physical Exam  BP (!) 145/94   Pulse 99   Temp 98.8 F (37.1 C) (Oral)   Resp 16   Ht 5\' 4"  (1.626 m)   Wt 87.1 kg   LMP 05/01/2018   SpO2 98%   BMI 32.96 kg/m   Physical Exam Constitutional:      General: She is not in acute distress.    Appearance: Normal appearance. She is well-developed. She is obese. She is not ill-appearing or diaphoretic.  HENT:     Head: Normocephalic and atraumatic.     Right Ear: External ear normal.     Left Ear: External ear normal.     Nose: Nose normal.  Eyes:     General: Vision grossly intact. Gaze aligned appropriately.     Pupils: Pupils are equal, round, and reactive to light.  Neck:     Musculoskeletal: Normal range of motion.     Trachea: Trachea and phonation normal. No tracheal deviation.  Pulmonary:     Effort: Pulmonary effort is normal. No respiratory distress.  Abdominal:     General: There is no distension.     Palpations: Abdomen is soft.     Tenderness: There is no abdominal tenderness. There is no guarding or rebound.  Musculoskeletal: Normal range of motion.  Skin:    General: Skin is warm and dry.  Neurological:     Mental Status: She is alert.     GCS: GCS eye subscore is 4. GCS verbal subscore is 5. GCS motor subscore is 6.     Comments: Speech is clear and goal oriented, follows commands Major Cranial nerves without deficit, no facial droop Moves extremities without ataxia, coordination intact Normal gait  Psychiatric:        Behavior: Behavior normal.      ED Course/Procedures   Clinical Course as of May 13 1733  Tue May 14, 2018  1601 Follow-up on imaging, pending no actionable findings, d/c w/ pcp and neurosurg follow-up.   [BM]    Clinical Course User Index [BM] Deliah Boston, PA-C    Procedures  MDM  Beta-hCG negative CBC within normal limits BMP nonacute Urinalysis negative CT abdomen pelvis:  IMPRESSION:  1. No acute intra-abdominal abnormality detected. No findings to  explain the patient's back pain  2. Splenomegaly.  3. Probable hepatic steatosis.   CT L-Spine:  IMPRESSION:  1. No acute osseous abnormality.  2. Mild degenerative changes of the lower lumbar spine.  3. Degenerative changes of the bilateral sacroiliac joints.  --------------- Patient updated on imaging results today, encouraged PCP and neurosurgery follow-up.  Patient states understanding and is agreeable to plan of care.  On reevaluation patient is resting comfortably no acute distress she has been ambulatory to bathroom and back here well in the emergency department without assistance or difficulty.  Prescriptions from previous care team given, AVS completed by previous care team given.  At this time there does not appear to be any evidence of an acute emergency medical condition and the patient appears stable for  discharge with appropriate outpatient follow up. Diagnosis was discussed with patient who verbalizes understanding of care plan and is agreeable to discharge. I have discussed return precautions with patient who verbalizes understanding of return precautions. Patient encouraged to follow-up with their PCP and neurosurgery. All questions answered.  Patient has been discharged in [place] condition.   Note: Portions of this report may have been transcribed using voice recognition software. Every effort was made to ensure accuracy; however, inadvertent computerized transcription errors may still be present.      Deliah Boston,  PA-C 05/14/18 1745    Tegeler, Gwenyth Allegra, MD 05/15/18 [phone]

## 2018-05-14 NOTE — ED Provider Notes (Signed)
[name] Provider Note   CSN: [id number] Arrival date & time: 05/14/18  1308    History   Chief Complaint No chief complaint on file.   HPI Rachel Wilkerson is a 33 y.o. female with history of Hodgkin's lymphoma, GERD, hypertension, depression, anxiety presents for evaluation of acute onset, progressively worsening low back pain for 1 week.  She reports that pain is midline, sharp and throbbing, radiating to the right side and down to the right hip and right lower extremity.  Pain is constant, worsens with movements and ambulation.  She has applied topical lidocaine patches, taking ibuprofen, applied heat, muscle rub without relief of symptoms.  She denies fevers, abdominal pain, chest pain, shortness of breath, or vomiting but does note some nausea.  She also tells me that she has had some urinary frequency and urgency but denies dysuria or hematuria.  She denies bowel or bladder incontinence, saddle anesthesia, or history of IV drug use.  She does have a history of Hodgkin's lymphoma which she reports she has been in remission for 3 years.  She does tell me that she has had progressively worsening night sweats over the last 2 months as well as a 30 pound weight loss in the last month unintentionally.  Her oncologist is in [place] and she reports that she was scheduled to follow-up last month but due to the COVID-19 pandemic her follow-up was rescheduled to the end of this month.  Has a history of prior lumbar spine surgery around 5 years ago.     The history is provided by the patient.    Past Medical History:  Diagnosis Date   Anxiety    Back pain    Cancer ([place])    hodkings lymphoma 2016   Depression    GERD (gastroesophageal reflux disease)    Hypertension     There are no active problems to display for this patient.   Past Surgical History:  Procedure Laterality Date   BACK SURGERY     BREAST SURGERY     LEG SURGERY         OB History   No obstetric history on file.      Home Medications    Prior to Admission medications   Medication Sig Start Date End Date Taking? Authorizing Provider  amoxicillin-clavulanate (AUGMENTIN) 875-125 MG tablet Take 1 tablet by mouth 2 (two) times daily. 01/13/18   Recardo Evangelist, PA-C  atenolol (TENORMIN) 25 MG tablet Take 1 tablet (25 mg total) by mouth daily. 01/13/18   Recardo Evangelist, PA-C  lidocaine (LIDODERM) 5 % Place 1 patch onto the skin daily. Remove & Discard patch within 12 hours or as directed by MD 05/14/18   Rodell Perna A, PA-C  methocarbamol (ROBAXIN) 500 MG tablet Take 1 tablet (500 mg total) by mouth 2 (two) times daily as needed for muscle spasms. 05/14/18   Warnell Rasnic A, PA-C  predniSONE (STERAPRED UNI-PAK 21 TAB) 10 MG (21) TBPK tablet Take by mouth daily. Take 6 tabs by mouth daily  for 2 days, then 5 tabs for 2 days, then 4 tabs for 2 days, then 3 tabs for 2 days, 2 tabs for 2 days, then 1 tab by mouth daily for 2 days 05/14/18   Renita Papa, PA-C    Family History No family history on file.  Social History Social History   Tobacco Use   Smoking status: Never Smoker   Smokeless tobacco: Never Used  Substance Use Topics   Alcohol use: Not Currently   Drug use: Never     Allergies   Erythromycin and Tramadol   Review of Systems Review of Systems  Constitutional: Positive for unexpected weight change. Negative for chills and fever.  Respiratory: Negative for shortness of breath.   Cardiovascular: Negative for chest pain.  Gastrointestinal: Positive for nausea. Negative for abdominal pain, constipation, diarrhea and vomiting.  Genitourinary: Positive for frequency and urgency. Negative for dysuria, hematuria, vaginal bleeding, vaginal discharge and vaginal pain.  Neurological: Negative for weakness and numbness.  All other systems reviewed and are negative.    Physical Exam Updated Vital Signs BP (!) 144/97 (BP Location:  Left Arm)    Pulse 88    Temp 98 F (36.7 C) (Oral)    Resp 16    Ht 5\' 4"  (1.626 m)    Wt 87.1 kg    LMP 05/01/2018    SpO2 98%    BMI 32.96 kg/m   Physical Exam Vitals signs and nursing note reviewed.  Constitutional:      General: She is not in acute distress.    Appearance: She is well-developed.  HENT:     Head: Normocephalic and atraumatic.  Eyes:     General:        Right eye: No discharge.        Left eye: No discharge.     Conjunctiva/sclera: Conjunctivae normal.  Neck:     Musculoskeletal: Normal range of motion and neck supple.     Vascular: No JVD.     Trachea: No tracheal deviation.  Cardiovascular:     Rate and Rhythm: Normal rate and regular rhythm.  Pulmonary:     Effort: Pulmonary effort is normal.     Breath sounds: Normal breath sounds.  Abdominal:     General: Bowel sounds are normal. There is no distension.     Palpations: Abdomen is soft.     Tenderness: There is no abdominal tenderness. There is no guarding or rebound.     Hernia: No hernia is present.  Musculoskeletal:        General: Tenderness present.     Right hip: She exhibits normal range of motion, normal strength, no tenderness, no swelling, no crepitus and no deformity.     Right lower leg: No edema.     Left lower leg: No edema.     Comments: Diffuse midline lumbar spine tenderness with right-sided paralumbar muscle tenderness.  5/5 strength of BLE major muscle groups.  Well-healed midline surgical scar.  Positive straight leg raise on the right.  Limited range of motion of the lumbar spine with flexion and extension secondary to pain.  Skin:    General: Skin is warm and dry.     Findings: No erythema.  Neurological:     Mental Status: She is alert.     Comments: Fluent speech, no facial droop.  Sensation intact to soft touch of upper and lower extremities bilaterally.  Ambulates with an antalgic gait but able to heel walk and toe walk without difficulty and exhibits Pieri balance.    Psychiatric:        Behavior: Behavior normal.      ED Treatments / Results  Labs (all labs ordered are listed, but only abnormal results are displayed) Labs Reviewed  BASIC METABOLIC PANEL - Abnormal; Notable for the following components:      Result Value   Glucose, Bld 132 (*)    All other  components within normal limits  URINALYSIS, ROUTINE W REFLEX MICROSCOPIC  CBC WITH DIFFERENTIAL/PLATELET  I-STAT BETA HCG BLOOD, ED ([facility], [facility], [facility] ONLY)    EKG None  Radiology Ct Abdomen Pelvis W Contrast  Result Date: 05/14/2018 CLINICAL DATA:  Back pain. EXAM: CT ABDOMEN AND PELVIS WITH CONTRAST TECHNIQUE: Multidetector CT imaging of the abdomen and pelvis was performed using the standard protocol following bolus administration of intravenous contrast. CONTRAST:  143mL OMNIPAQUE IOHEXOL 300 MG/ML  SOLN COMPARISON:  None. FINDINGS: Lower chest: No acute abnormality. Hepatobiliary: There is probable underlying hepatic steatosis. The gallbladder is decompressed and otherwise unremarkable. There is no biliary ductal dilatation. Pancreas: Unremarkable. No pancreatic ductal dilatation or surrounding inflammatory changes. Spleen: The spleen is significantly enlarged measuring approximately 14.5 cm craniocaudad. Adrenals/Urinary Tract: Adrenal glands are unremarkable. Kidneys are normal, without renal calculi, focal lesion, or hydronephrosis. Bladder is unremarkable. Stomach/Bowel: Findings are suspicious for a possible rectocele. There is a moderate amount of stool throughout the colon. The stomach is moderately distended. There is no evidence of a small-bowel obstruction. The appendix is not reliably identified. Vascular/Lymphatic: No significant vascular findings are present. No enlarged abdominal or pelvic lymph nodes. Reproductive: Uterus and bilateral adnexa are unremarkable. Other: No abdominal wall hernia or abnormality. No abdominopelvic ascites. Musculoskeletal: There is no acute displaced fracture.  There are degenerative changes of the bilateral sacroiliac joints. IMPRESSION: 1. No acute intra-abdominal abnormality detected. No findings to explain the patient's back pain 2. Splenomegaly. 3. Probable hepatic steatosis. Electronically Signed   By: Constance Holster M.D.   On: 05/14/2018 17:02   Ct L-spine No Charge  Result Date: 05/14/2018 CLINICAL DATA:  Back pain EXAM: CT LUMBAR SPINE WITHOUT CONTRAST TECHNIQUE: Multidetector CT imaging of the lumbar spine was performed without intravenous contrast administration. Multiplanar CT image reconstructions were also generated. COMPARISON:  None. FINDINGS: Segmentation: 5 lumbar type vertebrae. Alignment: Normal. Vertebrae: No acute fracture or focal pathologic process. There are degenerative changes of the bilateral sacroiliac joints. Paraspinal and other soft tissues: Negative. Disc levels: There is mild multi disc height loss, greatest at the L5-S1 level. There is multilevel facet arthrosis at the lower lumbar segments. IMPRESSION: 1. No acute osseous abnormality. 2. Mild degenerative changes of the lower lumbar spine. 3. Degenerative changes of the bilateral sacroiliac joints. Electronically Signed   By: Constance Holster M.D.   On: 05/14/2018 17:27    Procedures Procedures (including critical care time)  Medications Ordered in ED Medications  HYDROcodone-acetaminophen (NORCO/VICODIN) 5-325 MG per tablet 1 tablet (1 tablet Oral Given 05/14/18 1421)  ondansetron (ZOFRAN-ODT) disintegrating tablet 4 mg (4 mg Oral Given 05/14/18 1422)  ketorolac (TORADOL) 30 MG/ML injection 30 mg (30 mg Intravenous Given 05/14/18 1653)  iohexol (OMNIPAQUE) 300 MG/ML solution 100 mL (100 mLs Intravenous Contrast Given 05/14/18 1625)     Initial Impression / Assessment and Plan / ED Course  I have reviewed the triage vital signs and the nursing notes.  Pertinent labs & imaging results that were available during my care of the patient were reviewed by me and  considered in my medical decision making (see chart for details).  Patient presenting for evaluation of mostly right-sided low back pain with radicular symptoms down the right lower extremity. She is afebrile, initially somewhat hypertensive with improvement while in the ED. Likely in response to pain as it improved with pain control.  Pain is reproducible on palpation.  She is neurovascularly intact.  Ambulatory despite pain.  No evidence of septic joint on  examination.  No focal tenderness to suggest discitis.  She does have a history of Hodgkin's lymphoma for which she has been in remission for 3 years but missed her latest follow-up appoint with her oncologist due to the COVID-19 pandemic.  Given history of malignancy will obtain imaging of the abdomen pelvis and L-spine for further evaluation, rule out occult fracture versus bony mets.  Low suspicion of cauda equina syndrome or spinal abscess.  Lab work reviewed by me shows no leukocytosis, no anemia, no metabolic derangements which is reassuring.   4:18PM Signed out to oncoming provider PA [place].  Pending imaging results.  If imaging is reassuring, plan to discharge home with steroid taper, muscle relaxer, lidocaine patches.  Also discussed conservative therapy and management with gentle stretching, ice and heat therapy.  Advised of appropriate use of medications.  We will refer her to a neurosurgeon and orthopedist here as most of her care is received [place], [place].  We discussed strict ED return precautions.  Patient verbalized understanding of and agreement with tentative plan pending imaging. Final Clinical Impressions(s) / ED Diagnoses   Final diagnoses:  Back pain  Acute right-sided low back pain with right-sided sciatica    ED Discharge Orders         Ordered    predniSONE (STERAPRED UNI-PAK 21 TAB) 10 MG (21) TBPK tablet  Daily     05/14/18 1618    methocarbamol (ROBAXIN) 500 MG tablet  2 times daily PRN     05/14/18 1618     lidocaine (LIDODERM) 5 %  Every 24 hours     05/14/18 1618           Renita Papa, PA-C 05/15/18 0721    Carmin Muskrat, MD 05/17/18 1945

## 2018-05-14 NOTE — ED Notes (Signed)
Ambulatory to BR with minimal guarding to back.

## 2018-06-07 ENCOUNTER — Encounter (HOSPITAL_COMMUNITY): Payer: Self-pay | Admitting: Emergency Medicine

## 2018-06-07 ENCOUNTER — Other Ambulatory Visit: Payer: Self-pay

## 2018-06-07 ENCOUNTER — Emergency Department (HOSPITAL_COMMUNITY): Payer: Medicaid - Out of State

## 2018-06-07 ENCOUNTER — Emergency Department (HOSPITAL_COMMUNITY)
Admission: EM | Admit: 2018-06-07 | Discharge: 2018-06-07 | Disposition: A | Discharge status: Home / Self Care | Payer: Medicaid - Out of State | Attending: Emergency Medicine | Admitting: Emergency Medicine

## 2018-06-07 DIAGNOSIS — C8590 Non-Hodgkin lymphoma, unspecified, unspecified site: Secondary | ICD-10-CM | POA: Diagnosis not present

## 2018-06-07 DIAGNOSIS — I10 Essential (primary) hypertension: Secondary | ICD-10-CM | POA: Insufficient documentation

## 2018-06-07 DIAGNOSIS — R05 Cough: Secondary | ICD-10-CM | POA: Diagnosis not present

## 2018-06-07 DIAGNOSIS — R0789 Other chest pain: Secondary | ICD-10-CM | POA: Diagnosis present

## 2018-06-07 DIAGNOSIS — Z79899 Other long term (current) drug therapy: Secondary | ICD-10-CM | POA: Insufficient documentation

## 2018-06-07 DIAGNOSIS — R059 Cough, unspecified: Secondary | ICD-10-CM

## 2018-06-07 DIAGNOSIS — J189 Pneumonia, unspecified organism: Secondary | ICD-10-CM | POA: Diagnosis not present

## 2018-06-07 MED ORDER — LEVOFLOXACIN 750 MG PO TABS
750.0000 mg | ORAL_TABLET | Freq: Once | ORAL | Status: AC
  Administered 2018-06-07: 750 mg via ORAL
  Filled 2018-06-07: qty 1

## 2018-06-07 NOTE — ED Triage Notes (Signed)
Pt states she was diagnosed with pneumonia today. C/o cough, chest pain and dry mouth.

## 2018-06-07 NOTE — Discharge Instructions (Signed)
Take the Levaquin as directed.  See your Physician for recheck.  Return if any problems.

## 2018-06-07 NOTE — ED Notes (Signed)
Patient Alert and oriented to baseline. Stable and ambulatory to baseline. Patient verbalized understanding of the discharge instructions.  Patient belongings were taken by the patient.   

## 2018-06-07 NOTE — ED Provider Notes (Signed)
[facility] Provider Note   CSN: [id number] Arrival date & time: 06/07/18  1620    History   Chief Complaint Chief Complaint  Patient presents with  . Chest Pain    HPI Rachel Wilkerson is a 33 y.o. female.     History of Hodgkin's lymphoma.  Initially diagnosed in 2016 patient had a CT scan done by her oncologist today in [place] who called her and told her that she had a pneumonia and called in a prescription for Levaquin.  Patient reports diagnosed concerned her thus she came to the emergency department for evaluation.  I am unable to obtain patient's records on care everywhere.  Patient's personal chart on her phone shows result is pending.  She denies fever she denies chills not currently short of breath denies any trouble breathing patient is requesting COVID test.  No language interpreter was used.  Cough  Cough characteristics:  Croupy Sputum characteristics:  Nondescript Severity:  Mild Timing:  Constant Chronicity:  New Relieved by:  Nothing Worsened by:  Nothing Ineffective treatments:  None tried Associated symptoms: no chest pain, no fever and no shortness of breath     Past Medical History:  Diagnosis Date  . Anxiety   . Back pain   . Cancer ([facility])    hodkings lymphoma 2016  . Depression   . GERD (gastroesophageal reflux disease)   . Hypertension     There are no active problems to display for this patient.   Past Surgical History:  Procedure Laterality Date  . BACK SURGERY    . BREAST SURGERY    . LEG SURGERY       OB History   No obstetric history on file.      Home Medications    Prior to Admission medications   Medication Sig Start Date End Date Taking? Authorizing Provider  amoxicillin-clavulanate (AUGMENTIN) 875-125 MG tablet Take 1 tablet by mouth 2 (two) times daily. 01/13/18   Recardo Evangelist, PA-C  atenolol (TENORMIN) 25 MG tablet Take 1 tablet (25 mg total) by mouth daily. 01/13/18    Recardo Evangelist, PA-C  lidocaine (LIDODERM) 5 % Place 1 patch onto the skin daily. Remove & Discard patch within 12 hours or as directed by MD 05/14/18   Rodell Perna A, PA-C  methocarbamol (ROBAXIN) 500 MG tablet Take 1 tablet (500 mg total) by mouth 2 (two) times daily as needed for muscle spasms. 05/14/18   Fawze, Mina A, PA-C  predniSONE (STERAPRED UNI-PAK 21 TAB) 10 MG (21) TBPK tablet Take by mouth daily. Take 6 tabs by mouth daily  for 2 days, then 5 tabs for 2 days, then 4 tabs for 2 days, then 3 tabs for 2 days, 2 tabs for 2 days, then 1 tab by mouth daily for 2 days 05/14/18   Renita Papa, PA-C    Family History No family history on file.  Social History Social History   Tobacco Use  . Smoking status: Never Smoker  . Smokeless tobacco: Never Used  Substance Use Topics  . Alcohol use: Not Currently  . Drug use: Never     Allergies   Erythromycin and Tramadol   Review of Systems Review of Systems  Constitutional: Negative for fever.  Respiratory: Positive for cough. Negative for shortness of breath.   Cardiovascular: Negative for chest pain.  All other systems reviewed and are negative.    Physical Exam Updated Vital Signs BP (!) 142/89  Pulse 100   Temp 98.4 F (36.9 C) (Oral)   Resp 18   SpO2 95%   Physical Exam Vitals signs and nursing note reviewed.  Constitutional:      Appearance: She is well-developed.  HENT:     Head: Normocephalic.  Neck:     Musculoskeletal: Normal range of motion.  Cardiovascular:     Rate and Rhythm: Normal rate.     Heart sounds: Normal heart sounds.  Pulmonary:     Effort: Pulmonary effort is normal.     Breath sounds: Normal breath sounds.  Abdominal:     General: There is no distension.     Palpations: Abdomen is soft.  Musculoskeletal: Normal range of motion.  Skin:    General: Skin is warm.  Neurological:     General: No focal deficit present.     Mental Status: She is alert and oriented to person, place,  and time.      ED Treatments / Results  Labs (all labs ordered are listed, but only abnormal results are displayed) Labs Reviewed - No data to display  EKG None  Radiology No results found.  Procedures Procedures (including critical care time)  Medications Ordered in ED Medications - No data to display   Initial Impression / Assessment and Plan / ED Course  I have reviewed the triage vital signs and the nursing notes.  Pertinent labs & imaging results that were available during my care of the patient were reviewed by me and considered in my medical decision making (see chart for details).        MDM  Portabable chest xray  No acute abnormality.  Pt looks well over all.  Pt reports her MD called in Levaquin but she can not get until tomorrow.  I will give pt 1st dose here.  Pt is advised to take her medications as directed.  Follow up with her MD.  Return if symptoms worsen or change.   Final Clinical Impressions(s) / ED Diagnoses   Final diagnoses:  Cough  Community acquired pneumonia, unspecified laterality    ED Discharge Orders    None    An After Visit Summary was printed and given to the patient.    Sidney Ace 06/07/18 Deloris Ping, MD 06/10/18 [phone]

## 2018-06-18 ENCOUNTER — Emergency Department (HOSPITAL_COMMUNITY): Payer: Medicaid - Out of State

## 2018-06-18 ENCOUNTER — Other Ambulatory Visit: Payer: Self-pay

## 2018-06-18 ENCOUNTER — Emergency Department (HOSPITAL_COMMUNITY)
Admission: EM | Admit: 2018-06-18 | Discharge: 2018-06-18 | Disposition: A | Discharge status: Home / Self Care | Payer: Medicaid - Out of State | Attending: Emergency Medicine | Admitting: Emergency Medicine

## 2018-06-18 ENCOUNTER — Encounter (HOSPITAL_COMMUNITY): Payer: Self-pay | Admitting: Emergency Medicine

## 2018-06-18 DIAGNOSIS — F419 Anxiety disorder, unspecified: Secondary | ICD-10-CM | POA: Insufficient documentation

## 2018-06-18 DIAGNOSIS — R410 Disorientation, unspecified: Secondary | ICD-10-CM | POA: Diagnosis present

## 2018-06-18 DIAGNOSIS — J189 Pneumonia, unspecified organism: Secondary | ICD-10-CM | POA: Insufficient documentation

## 2018-06-18 DIAGNOSIS — I10 Essential (primary) hypertension: Secondary | ICD-10-CM | POA: Diagnosis not present

## 2018-06-18 DIAGNOSIS — Z79899 Other long term (current) drug therapy: Secondary | ICD-10-CM | POA: Diagnosis not present

## 2018-06-18 DIAGNOSIS — R0602 Shortness of breath: Secondary | ICD-10-CM | POA: Diagnosis not present

## 2018-06-18 DIAGNOSIS — Z8571 Personal history of Hodgkin lymphoma: Secondary | ICD-10-CM | POA: Insufficient documentation

## 2018-06-18 LAB — URINALYSIS, ROUTINE W REFLEX MICROSCOPIC
Bilirubin Urine: NEGATIVE
Glucose, UA: NEGATIVE mg/dL
Hgb urine dipstick: NEGATIVE
Ketones, ur: NEGATIVE mg/dL
Leukocytes,Ua: NEGATIVE
Nitrite: NEGATIVE
Protein, ur: NEGATIVE mg/dL
Specific Gravity, Urine: 1.013 (ref 1.005–1.030)
pH: 6 (ref 5.0–8.0)

## 2018-06-18 LAB — COMPREHENSIVE METABOLIC PANEL
ALT: 30 U/L (ref 0–44)
AST: 28 U/L (ref 15–41)
Albumin: 4.2 g/dL (ref 3.5–5.0)
Alkaline Phosphatase: 61 U/L (ref 38–126)
Anion gap: 13 (ref 5–15)
BUN: 7 mg/dL (ref 6–20)
CO2: 26 mmol/L (ref 22–32)
Calcium: 9.7 mg/dL (ref 8.9–10.3)
Chloride: 97 mmol/L — ABNORMAL LOW (ref 98–111)
Creatinine, Ser: 0.99 mg/dL (ref 0.44–1.00)
GFR calc Af Amer: >60 mL/min (ref >60)
GFR calc non Af Amer: >60 mL/min (ref >60)
Glucose, Bld: 127 mg/dL — ABNORMAL HIGH (ref 70–99)
Potassium: 3.8 mmol/L (ref 3.5–5.1)
Sodium: 136 mmol/L (ref 135–145)
Total Bilirubin: 0.9 mg/dL (ref 0.3–1.2)
Total Protein: 7.8 g/dL (ref 6.5–8.1)

## 2018-06-18 LAB — CBC WITH DIFFERENTIAL/PLATELET
Abs Immature Granulocytes: 0.08 10*3/uL — ABNORMAL HIGH (ref 0.00–0.07)
Basophils Absolute: 0 10*3/uL (ref 0.0–0.1)
Basophils Relative: 0 %
Eosinophils Absolute: 0.2 10*3/uL (ref 0.0–0.5)
Eosinophils Relative: 2 %
HCT: 38.8 % (ref 36.0–46.0)
Hemoglobin: 13.3 g/dL (ref 12.0–15.0)
Immature Granulocytes: 1 %
Lymphocytes Relative: 21 %
Lymphs Abs: 2.4 10*3/uL (ref 0.7–4.0)
MCH: 28.7 pg (ref 26.0–34.0)
MCHC: 34.3 g/dL (ref 30.0–36.0)
MCV: 83.6 fL (ref 80.0–100.0)
Monocytes Absolute: 0.7 10*3/uL (ref 0.1–1.0)
Monocytes Relative: 6 %
Neutro Abs: 8 10*3/uL — ABNORMAL HIGH (ref 1.7–7.7)
Neutrophils Relative %: 70 %
Platelets: 347 10*3/uL (ref 150–400)
RBC: 4.64 MIL/uL (ref 3.87–5.11)
RDW: 12.7 % (ref 11.5–15.5)
WBC: 11.5 10*3/uL — ABNORMAL HIGH (ref 4.0–10.5)
nRBC: 0 % (ref 0.0–0.2)

## 2018-06-18 LAB — RAPID URINE DRUG SCREEN, HOSP PERFORMED
Amphetamines: NOT DETECTED
Barbiturates: NOT DETECTED
Benzodiazepines: NOT DETECTED
Cocaine: NOT DETECTED
Opiates: POSITIVE — AB
Tetrahydrocannabinol: NOT DETECTED

## 2018-06-18 LAB — SALICYLATE LEVEL: Salicylate Lvl: <7 mg/dL (ref 2.8–30.0)

## 2018-06-18 LAB — I-STAT BETA HCG BLOOD, ED (MC, WL, AP ONLY): I-stat hCG, quantitative: <5 m[IU]/mL (ref <5)

## 2018-06-18 LAB — ETHANOL: Alcohol, Ethyl (B): <10 mg/dL (ref <10)

## 2018-06-18 LAB — ACETAMINOPHEN LEVEL: Acetaminophen (Tylenol), Serum: <10 ug/mL — ABNORMAL LOW (ref 10–30)

## 2018-06-18 MED ORDER — DOXYCYCLINE HYCLATE 100 MG PO CAPS: 100.0000 mg | ORAL_CAPSULE | Freq: Two times a day (BID) | ORAL | 0 refills | Status: AC

## 2018-06-18 MED ORDER — LORAZEPAM 2 MG/ML IJ SOLN
1.0000 mg | Freq: Once | INTRAMUSCULAR | Status: AC
  Administered 2018-06-18: 1 mg via INTRAVENOUS
  Filled 2018-06-18: qty 1

## 2018-06-18 MED ORDER — SODIUM CHLORIDE 0.9 % IV BOLUS
1000.0000 mL | Freq: Once | INTRAVENOUS | Status: AC
  Administered 2018-06-18: 1000 mL via INTRAVENOUS

## 2018-06-18 MED ORDER — HYDROXYZINE HCL 25 MG PO TABS: 25.0000 mg | ORAL_TABLET | Freq: Three times a day (TID) | ORAL | 0 refills | Status: DC | PRN

## 2018-06-18 MED ORDER — KETOROLAC TROMETHAMINE 15 MG/ML IJ SOLN
15.0000 mg | Freq: Once | INTRAMUSCULAR | Status: AC
  Administered 2018-06-18: 15 mg via INTRAVENOUS
  Filled 2018-06-18: qty 1

## 2018-06-18 NOTE — ED Provider Notes (Signed)
Medical screening examination/treatment/procedure(s) were conducted as a shared visit with non-physician practitioner(s) and myself.  I personally evaluated the patient during the encounter.    33 year old female who presents with periods of confusion she thinks is related to recently starting on Levaquin.  Denies any fever or chills.  No focal neurological deficits here.  Will perform head CT remaining labs.  Suspect anxiety is component to this.  Will likely discharge home   Lacretia Leigh, MD 06/18/18 1232

## 2018-06-18 NOTE — ED Notes (Signed)
Patient transported to CT 

## 2018-06-18 NOTE — ED Provider Notes (Signed)
[facility] EMERGENCY DEPARTMENT Provider Note   CSN: [id number] Arrival date & time: 06/18/18  1037     History   Chief Complaint No chief complaint on file.   HPI Rachel Wilkerson is a 33 y.o. female senting for evaluation of confusion and shortness of breath.  Patient states she has had increased confusion for the past 10 days since she was started on Levaquin for pneumonia.  Patient was diagnosed with pneumonia after having a CT scan for routine check for return of cancer.  Patient states that she started Levaquin, and that night she started to have abnormal symptoms.  She states at first she felt like but she wallpaper is moving and coming out towards her.  She has not had any other visual hallucinations like that since.  However, since then she has been "talking out of her head."  He also states she is occasionally seeing double.  She stopped taking Levaquin.  Her gabapentin dose was also increased from 300-400, and she is taking 2 tablets at night.  She states she is feeling very anxious.  She also reports worsening shortness of breath.  She denies fever, chills, sore throat, cough, chest pain, nausea, vomiting, abdominal pain, normal bowel movements.  Patient states she has had to strain a lot with her urination, but this is being followed with a urologist.  This is a new issue. Patient states she is not taking anything for anxiety, but she does have a companion animal. She has an appointment tomorrow with her oncologist to discuss the results of the CT.  Patient was told that there was concern that she may have return of her cancer.      HPI  Past Medical History:  Diagnosis Date   Anxiety    Back pain    Cancer ([place])    hodkings lymphoma 2016   Depression    GERD (gastroesophageal reflux disease)    Hypertension     There are no active problems to display for this patient.   Past Surgical History:  Procedure Laterality Date   BACK SURGERY       BREAST SURGERY     LEG SURGERY       OB History   No obstetric history on file.      Home Medications    Prior to Admission medications   Medication Sig Start Date End Date Taking? Authorizing Provider  amoxicillin-clavulanate (AUGMENTIN) 875-125 MG tablet Take 1 tablet by mouth 2 (two) times daily. 01/13/18   Recardo Evangelist, PA-C  atenolol (TENORMIN) 25 MG tablet Take 1 tablet (25 mg total) by mouth daily. 01/13/18   Recardo Evangelist, PA-C  doxycycline (VIBRAMYCIN) 100 MG capsule Take 1 capsule (100 mg total) by mouth 2 (two) times daily for 7 days. 06/18/18 06/25/18  Himani Corona, PA-C  hydrOXYzine (ATARAX/VISTARIL) 25 MG tablet Take 1 tablet (25 mg total) by mouth every 8 (eight) hours as needed for anxiety. 06/18/18   Glorimar Stroope, PA-C  lidocaine (LIDODERM) 5 % Place 1 patch onto the skin daily. Remove & Discard patch within 12 hours or as directed by MD 05/14/18   Rodell Perna A, PA-C  methocarbamol (ROBAXIN) 500 MG tablet Take 1 tablet (500 mg total) by mouth 2 (two) times daily as needed for muscle spasms. 05/14/18   Fawze, Mina A, PA-C  predniSONE (STERAPRED UNI-PAK 21 TAB) 10 MG (21) TBPK tablet Take by mouth daily. Take 6 tabs by mouth daily  for 2 days,  then 5 tabs for 2 days, then 4 tabs for 2 days, then 3 tabs for 2 days, 2 tabs for 2 days, then 1 tab by mouth daily for 2 days 05/14/18   Renita Papa, PA-C    Family History No family history on file.  Social History Social History   Tobacco Use   Smoking status: Never Smoker   Smokeless tobacco: Never Used  Substance Use Topics   Alcohol use: Not Currently   Drug use: Never     Allergies   Erythromycin and Tramadol   Review of Systems Review of Systems  Respiratory: Positive for shortness of breath.   Genitourinary: Positive for difficulty urinating (chronic).  Psychiatric/Behavioral: Positive for confusion. The patient is nervous/anxious.   All other systems reviewed and are  negative.    Physical Exam Updated Vital Signs BP (!) 157/88    Pulse 100    Temp 98 F (36.7 C) (Oral)    Resp 14    Ht 5\' 4"  (1.626 m)    Wt 90.7 kg    SpO2 96%    BMI 34.33 kg/m   Physical Exam Vitals signs and nursing note reviewed.  Constitutional:      General: She is not in acute distress.    Appearance: She is well-developed.     Comments: Obese female who appears anxious, but nontoxic  HENT:     Head: Normocephalic and atraumatic.  Eyes:     Extraocular Movements: Extraocular movements intact.     Conjunctiva/sclera: Conjunctivae normal.     Pupils: Pupils are equal, round, and reactive to light.  Neck:     Musculoskeletal: Normal range of motion and neck supple.  Cardiovascular:     Rate and Rhythm: Regular rhythm. Tachycardia present.     Pulses: Normal pulses.     Comments: HR ~100. When pt becomes anxious or starts crying, HR increase to 110 Pulmonary:     Effort: Pulmonary effort is normal. No respiratory distress.     Breath sounds: Normal breath sounds. No wheezing.     Comments: Speaking in full sentences Abdominal:     General: There is no distension.     Palpations: Abdomen is soft. There is no mass.     Tenderness: There is no abdominal tenderness. There is no guarding or rebound.  Musculoskeletal: Normal range of motion.     Comments: No leg pain or swelling  Skin:    General: Skin is warm and dry.     Capillary Refill: Capillary refill takes less than 2 seconds.  Neurological:     General: No focal deficit present.     Mental Status: She is alert and oriented to person, place, and time.     Comments: No obvious neurologic deficit.  CN intact.  Nose to finger intact.  Fine movement and coordination intact.  Psychiatric:        Mood and Affect: Mood is anxious.     Comments: Pt is very anxious. Crying multiple times throughout H and P.       ED Treatments / Results  Labs (all labs ordered are listed, but only abnormal results are  displayed) Labs Reviewed  CBC WITH DIFFERENTIAL/PLATELET - Abnormal; Notable for the following components:      Result Value   WBC 11.5 (*)    Neutro Abs 8.0 (*)    Abs Immature Granulocytes 0.08 (*)    All other components within normal limits  COMPREHENSIVE METABOLIC PANEL - Abnormal; Notable  for the following components:   Chloride 97 (*)    Glucose, Bld 127 (*)    All other components within normal limits  ACETAMINOPHEN LEVEL - Abnormal; Notable for the following components:   Acetaminophen (Tylenol), Serum <10 (*)    All other components within normal limits  RAPID URINE DRUG SCREEN, HOSP PERFORMED - Abnormal; Notable for the following components:   Opiates POSITIVE (*)    All other components within normal limits  ETHANOL  SALICYLATE LEVEL  URINALYSIS, ROUTINE W REFLEX MICROSCOPIC  I-STAT BETA HCG BLOOD, ED ([facility], [facility], [facility] ONLY)    EKG    Radiology Dg Chest 2 View  Result Date: 06/18/2018 CLINICAL DATA:  Fever. EXAM: CHEST - 2 VIEW COMPARISON:  06/07/2018. FINDINGS: Mediastinum hilar structures normal. Mild atelectasis/infiltrate medial right lung base. No pleural effusion or pneumothorax. Degenerative change thoracic spine. Surgical clips left chest. IMPRESSION: Mild atelectasis/infiltrate medial right lung base. Electronically Signed   By: Marcello Moores  Register   On: 06/18/2018 12:55   Ct Head Wo Contrast  Result Date: 06/18/2018 CLINICAL DATA:  Patient with confusion and hallucinations. EXAM: CT HEAD WITHOUT CONTRAST TECHNIQUE: Contiguous axial images were obtained from the base of the skull through the vertex without intravenous contrast. COMPARISON:  None. FINDINGS: Brain: Ventricles and sulci are appropriate for patient's age. No evidence for acute cortically based infarct, intracranial hemorrhage, mass lesion or mass-effect. Vascular: Unremarkable Skull: Intact. Sinuses/Orbits: Paranasal sinuses are well aerated. Orbits are unremarkable. Other: None. IMPRESSION: No acute  intracranial process. Electronically Signed   By: Lovey Newcomer M.D.   On: 06/18/2018 14:42    Procedures Procedures (including critical care time)  Medications Ordered in ED Medications  sodium chloride 0.9 % bolus 1,000 mL (0 mLs Intravenous Stopped 06/18/18 1344)  LORazepam (ATIVAN) injection 1 mg (1 mg Intravenous Given 06/18/18 1210)  ketorolac (TORADOL) 15 MG/ML injection 15 mg (15 mg Intravenous Given 06/18/18 1529)     Initial Impression / Assessment and Plan / ED Course  I have reviewed the triage vital signs and the nursing notes.  Pertinent labs & imaging results that were available during my care of the patient were reviewed by me and considered in my medical decision making (see chart for details).        Pt presenting for evaluation confusion shortness of breath.  Physical exam shows patient is anxious, but otherwise appears nontoxic.  Heart rate mildly elevated, this worsens and patient is crying.  Likely due to anxiety.  Additionally, patient reporting worsening shortness of breath.  Has been diagnosed with pneumonia, and only took half course of antibiotics.  Patient reporting confusion at home, but no focal deficits or confusion in the ED.  Reassuring neuro exam.  Will obtain labs, CT head, chest x-ray, and urine for further evaluation.  Will give fluids and Ativan for symptom control and reassess.  With fluids and Ativan, heart rate improved.  Patient is resting comfortably in the bed, heart rate is less than 100 on my exam.  At this time, low suspicion for PE.  X-ray viewed interpreted by me, shows a possible right lobe pneumonia.  As patient only took half course of antibiotics, will start her on a different antibiotic.  CT head negative. In the setting of a normal neuro exam, I do not believe she needs MRI today.  Labs reassuring, mild leukocytosis of 11.5, likely from the pneumonia.  UDS is positive for opioids.  Per chart review, patient is no longer prescribed opioids.  I  discussed with patient, who adamantly denies taking any pain pills. I discussed with patient that the source of her confusion may be due to anxiety versus increasing gabapentin versus opioids.  Discussed that her shortness of breath is likely due to combination of anxiety and possibly the pneumonia on x-ray.  Will treat with antibiotics.  Vistaril for anxiety.  I encourage patient to follow-up regarding her anxiety, resources given.  I also discussed with patient that is important that she follows up with her oncologist tomorrow to discuss the results of the CT, because she is like slightly anxious due to the fact that she may have return of her cancer.  Discussed importance of hydration and eating regular meals.  Case discussed with attending, Dr. Zenia Resides evaluated the patient.  At this time, patient appears safe for discharge.  Return precautions given.  Patient states she understands and agrees to plan.   Final Clinical Impressions(s) / ED Diagnoses   Final diagnoses:  Community acquired pneumonia of right middle lobe of lung ([place])  Anxiety    ED Discharge Orders         Ordered    hydrOXYzine (ATARAX/VISTARIL) 25 MG tablet  Every 8 hours PRN     06/18/18 1529    doxycycline (VIBRAMYCIN) 100 MG capsule  2 times daily     06/18/18 [place], Caylie Sandquist, PA-C 06/18/18 1628    Lacretia Leigh, MD 06/19/18 [phone]

## 2018-06-18 NOTE — ED Notes (Signed)
Patient verbalizes understanding of discharge instructions. Opportunity for questioning and answers were provided. Armband removed by staff, pt discharged from ED ambulatory to home.  

## 2018-06-18 NOTE — ED Notes (Signed)
Patient transported to X-ray 

## 2018-06-18 NOTE — Discharge Instructions (Addendum)
Start taking doxycycline for your shortness of breath and possible pneumonia. Continue follow-up with your doctor tomorrow to schedule appointment to discuss your cancer results. It is very important that you follow-up for further evaluation of your anxiety.  There is information about this in the paperwork.  Use Vistaril as needed for anxiety. Make sure you are staying well-hydrated water. Return to the emergency room with any new or worsening, concerning symptoms.

## 2018-06-18 NOTE — ED Triage Notes (Signed)
Pt recently diagnosed with pneumonia. Pt states since she started her medications she has recently become confused. Pt states she also has anxiety and panic attacks. Pt recently tested for COVID and it came back negative.

## 2018-07-04 ENCOUNTER — Other Ambulatory Visit: Payer: Self-pay

## 2018-07-04 ENCOUNTER — Emergency Department (HOSPITAL_COMMUNITY)
Admission: EM | Admit: 2018-07-04 | Discharge: 2018-07-04 | Disposition: A | Discharge status: Home / Self Care | Payer: Medicaid - Out of State | Attending: Emergency Medicine | Admitting: Emergency Medicine

## 2018-07-04 ENCOUNTER — Encounter (HOSPITAL_COMMUNITY): Payer: Self-pay | Admitting: *Deleted

## 2018-07-04 DIAGNOSIS — M25551 Pain in right hip: Secondary | ICD-10-CM

## 2018-07-04 DIAGNOSIS — Z8571 Personal history of Hodgkin lymphoma: Secondary | ICD-10-CM | POA: Diagnosis not present

## 2018-07-04 DIAGNOSIS — I10 Essential (primary) hypertension: Secondary | ICD-10-CM | POA: Diagnosis not present

## 2018-07-04 MED ORDER — DICLOFENAC SODIUM 1 % TD GEL: 2.0000 g | Freq: Four times a day (QID) | TRANSDERMAL | 0 refills | Status: DC

## 2018-07-04 MED ORDER — CYCLOBENZAPRINE HCL 10 MG PO TABS: 10.0000 mg | ORAL_TABLET | Freq: Two times a day (BID) | ORAL | 0 refills | Status: DC | PRN

## 2018-07-04 MED ORDER — KETOROLAC TROMETHAMINE 30 MG/ML IJ SOLN
30.0000 mg | Freq: Once | INTRAMUSCULAR | Status: AC
  Administered 2018-07-04: 30 mg via INTRAMUSCULAR
  Filled 2018-07-04: qty 1

## 2018-07-04 NOTE — ED Notes (Signed)
Pt discharged from ED; instructions provided and scripts given; Pt encouraged to return to ED if symptoms worsen and to f/u with PCP; Pt verbalized understanding of all instructions 

## 2018-07-04 NOTE — Discharge Instructions (Signed)
Apply voltaren gel to affected area several times daily for pain control.  Take flexeril for muscle spasm/tightness. Follow up with orthopedist for further care.

## 2018-07-04 NOTE — ED Provider Notes (Signed)
[facility] EMERGENCY DEPARTMENT Provider Note   CSN: [id number] Arrival date & time: 07/04/18  1844     History   Chief Complaint Chief Complaint  Patient presents with   Hip Pain    HPI Rachel Wilkerson is a 33 y.o. female.     The history is provided by the patient. No language interpreter was used.     33 year old female with hx of Hodgkin's lymphoma, L5-S1 herniated disc hip dysplasia, anxiety, back pain, htn presenting c/o R hip pain.  Patient report for the past 3 days she has had persistent pain about her right hip.  Pain is sharp, achy, throbbing, worsening with movement but present at rest and felt similar to prior hip dysplasia "flare".  Pain is moderate in severity without any associated fever, chills, bowel bladder incontinence or saddle anesthesia.  No knee or ankle pain.  No recent injury.  She has been using meloxicam and gabapentin with minimal relief.  She does not have an orthopedist at this time.  She was told that she would likely need a hip replacement for her condition but does not want any surgical intervention for the moment.  She denies any recent injury.  Past Medical History:  Diagnosis Date   Anxiety    Back pain    Cancer ([place])    hodkings lymphoma 2016   Depression    GERD (gastroesophageal reflux disease)    Hypertension     There are no active problems to display for this patient.   Past Surgical History:  Procedure Laterality Date   BACK SURGERY     BREAST SURGERY     LEG SURGERY       OB History   No obstetric history on file.      Home Medications    Prior to Admission medications   Medication Sig Start Date End Date Taking? Authorizing Provider  amoxicillin-clavulanate (AUGMENTIN) 875-125 MG tablet Take 1 tablet by mouth 2 (two) times daily. 01/13/18   Recardo Evangelist, PA-C  atenolol (TENORMIN) 25 MG tablet Take 1 tablet (25 mg total) by mouth daily. 01/13/18   Recardo Evangelist, PA-C    hydrOXYzine (ATARAX/VISTARIL) 25 MG tablet Take 1 tablet (25 mg total) by mouth every 8 (eight) hours as needed for anxiety. 06/18/18   Caccavale, Sophia, PA-C  lidocaine (LIDODERM) 5 % Place 1 patch onto the skin daily. Remove & Discard patch within 12 hours or as directed by MD 05/14/18   Rodell Perna A, PA-C  methocarbamol (ROBAXIN) 500 MG tablet Take 1 tablet (500 mg total) by mouth 2 (two) times daily as needed for muscle spasms. 05/14/18   Fawze, Mina A, PA-C  predniSONE (STERAPRED UNI-PAK 21 TAB) 10 MG (21) TBPK tablet Take by mouth daily. Take 6 tabs by mouth daily  for 2 days, then 5 tabs for 2 days, then 4 tabs for 2 days, then 3 tabs for 2 days, 2 tabs for 2 days, then 1 tab by mouth daily for 2 days 05/14/18   Renita Papa, PA-C    Family History No family history on file.  Social History Social History   Tobacco Use   Smoking status: Never Smoker   Smokeless tobacco: Never Used  Substance Use Topics   Alcohol use: Not Currently   Drug use: Never     Allergies   Erythromycin and Tramadol   Review of Systems Review of Systems  Constitutional: Negative for fever.  Musculoskeletal: Positive  for arthralgias.  Skin: Negative for rash and wound.  Neurological: Negative for numbness.     Physical Exam Updated Vital Signs BP 128/88 (BP Location: Right Arm)    Pulse 89    Temp 98.3 F (36.8 C) (Oral)    Resp 16    LMP 05/05/2018    SpO2 99%   Physical Exam Vitals signs and nursing note reviewed.  Constitutional:      General: She is not in acute distress.    Appearance: She is well-developed.  HENT:     Head: Atraumatic.  Eyes:     Conjunctiva/sclera: Conjunctivae normal.  Neck:     Musculoskeletal: Neck supple.  Musculoskeletal:        General: Tenderness (Right hip: Tenderness to lateral hip on palpation and along right inguinal region.  Normal hip flexion extension abduction abduction and no shortening of the leg or abnormal rotation.  No overlying skin  changes.) present.     Comments: Tenderness to right lumbar sacral region on palpation.  Skin:    Findings: No rash.  Neurological:     Mental Status: She is alert.     Comments: Able to ambulate.      ED Treatments / Results  Labs (all labs ordered are listed, but only abnormal results are displayed) Labs Reviewed - No data to display  EKG None  Radiology No results found.  Procedures Procedures (including critical care time)  Medications Ordered in ED Medications  ketorolac (TORADOL) 30 MG/ML injection 30 mg (30 mg Intramuscular Given 07/04/18 2211)     Initial Impression / Assessment and Plan / ED Course  I have reviewed the triage vital signs and the nursing notes.  Pertinent labs & imaging results that were available during my care of the patient were reviewed by me and considered in my medical decision making (see chart for details).        BP (!) 141/86 (BP Location: Right Arm)    Pulse 91    Temp 98.3 F (36.8 C) (Oral)    Resp 18    LMP 05/05/2018    SpO2 99%    Final Clinical Impressions(s) / ED Diagnoses   Final diagnoses:  Right hip pain    ED Discharge Orders         Ordered    cyclobenzaprine (FLEXERIL) 10 MG tablet  2 times daily PRN     07/04/18 2228    diclofenac sodium (VOLTAREN) 1 % GEL  4 times daily     07/04/18 2228         10:26 PM Patient with history of right hip dysplasia here with worsening right hip pain for the past 3 days.  No recent injury.  She is able to ambulate.  She has full range of motion about her right hip.  No signs of infection.  Will provide symptomatic treatment and recommend orthopedic referral as needed.  Return precaution discussed.   Domenic Moras, PA-C 07/04/18 2229    Carmin Muskrat, MD 07/04/18 [phone]

## 2018-07-04 NOTE — ED Triage Notes (Signed)
Pt reports hip dysplasia with flare ups, stating today lower right back and right hip pain. Ambulatory to triage with steady gait, no injury

## 2018-07-09 ENCOUNTER — Emergency Department (HOSPITAL_COMMUNITY)
Admission: EM | Admit: 2018-07-09 | Discharge: 2018-07-10 | Discharge status: Against Medical Advice | Payer: Medicaid - Out of State | Attending: Emergency Medicine | Admitting: Emergency Medicine

## 2018-07-09 ENCOUNTER — Other Ambulatory Visit: Payer: Self-pay

## 2018-07-09 ENCOUNTER — Encounter (HOSPITAL_COMMUNITY): Payer: Self-pay | Admitting: *Deleted

## 2018-07-09 DIAGNOSIS — Z5321 Procedure and treatment not carried out due to patient leaving prior to being seen by health care provider: Secondary | ICD-10-CM | POA: Diagnosis not present

## 2018-07-09 DIAGNOSIS — R42 Dizziness and giddiness: Secondary | ICD-10-CM | POA: Diagnosis present

## 2018-07-09 NOTE — ED Triage Notes (Signed)
PT says that after dinner she took her night time meds (about 30 minutes PTA- gabapentin, tramadol, atenolol, meloxicam, flexiril, benadryl, and "2 OTC sleep aides" and says that she has felt lightheaded since then.

## 2019-02-12 ENCOUNTER — Encounter: Payer: Self-pay | Admitting: Family Medicine

## 2019-02-12 ENCOUNTER — Other Ambulatory Visit: Payer: Self-pay

## 2019-02-12 ENCOUNTER — Ambulatory Visit: Payer: Medicaid Other | Attending: Family Medicine | Admitting: Family Medicine

## 2019-02-12 DIAGNOSIS — I10 Essential (primary) hypertension: Secondary | ICD-10-CM

## 2019-02-12 DIAGNOSIS — F419 Anxiety disorder, unspecified: Secondary | ICD-10-CM

## 2019-02-12 DIAGNOSIS — F5104 Psychophysiologic insomnia: Secondary | ICD-10-CM

## 2019-02-12 DIAGNOSIS — K219 Gastro-esophageal reflux disease without esophagitis: Secondary | ICD-10-CM

## 2019-02-12 DIAGNOSIS — F32A Depression, unspecified: Secondary | ICD-10-CM

## 2019-02-12 DIAGNOSIS — M5416 Radiculopathy, lumbar region: Secondary | ICD-10-CM

## 2019-02-12 DIAGNOSIS — G893 Neoplasm related pain (acute) (chronic): Secondary | ICD-10-CM

## 2019-02-12 DIAGNOSIS — R110 Nausea: Secondary | ICD-10-CM

## 2019-02-12 DIAGNOSIS — Z8571 Personal history of Hodgkin lymphoma: Secondary | ICD-10-CM

## 2019-02-12 DIAGNOSIS — F329 Major depressive disorder, single episode, unspecified: Secondary | ICD-10-CM

## 2019-02-12 MED ORDER — ATENOLOL 50 MG PO TABS: 50.0000 mg | ORAL_TABLET | Freq: Every day | ORAL | 1 refills | Status: DC

## 2019-02-12 MED ORDER — PROCHLORPERAZINE MALEATE 5 MG PO TABS: 5.0000 mg | ORAL_TABLET | Freq: Four times a day (QID) | ORAL | 0 refills | Status: DC | PRN

## 2019-02-12 MED ORDER — ESCITALOPRAM OXALATE 20 MG PO TABS: 20.0000 mg | ORAL_TABLET | Freq: Every day | ORAL | 0 refills | Status: DC

## 2019-02-12 MED ORDER — OMEPRAZOLE 40 MG PO CPDR: 40.0000 mg | DELAYED_RELEASE_CAPSULE | Freq: Two times a day (BID) | ORAL | 1 refills | Status: DC

## 2019-02-12 NOTE — Progress Notes (Signed)
Patient has been called and DOB has been verified. Patient has been screened and transferred to PCP to start phone visit.    Wants to discuss medication and refills.

## 2019-02-12 NOTE — Progress Notes (Signed)
Virtual Visit via Telephone Note  I connected with Rachel Wilkerson on 02/12/19 at  8:50 AM EST by telephone and verified that I am speaking with the correct person using two identifiers.   I discussed the limitations, risks, security and privacy concerns of performing an evaluation and management service by telephone and the availability of in person appointments. I also discussed with the patient that there may be a patient responsible charge related to this service. The patient expressed understanding and agreed to proceed.  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          She reports that she is new to the area and currently lives in [place].  She reports history of Hodgkin's lymphoma diagnosed at age 62 for which she had chemotherapy and now has resultant painful peripheral neuropathy in her hands and feet.  She has also had surgery due to herniated disc in her lower back and she reports that she continues to have chronic issues with back pain with radiation down the left leg.  Pain sometimes stops at the knee but sometimes radiates to the ankle and foot.  Between her chemotherapy-induced neuropathy and back pain with radiation, she reports that her pain stays between a 6-8 on a 0-to-10 scale with 0 being no pain and 10 being the worst possible pain.  She reports that she is not currently on medication for pain other than gabapentin which she currently takes 400 mg in the morning and 800 mg at night but states that this medication is no longer effective.          She also has  hypertension for which she takes atenolol 50 mg once daily and she does need refill of this medicine.  She denies any headaches or dizziness related to her blood pressure.  She also reports history of anxiety and depression for which she is currently on Lexapro but does not have any mental health follow-up since she has moved to the area and would like referral to establish with psychiatry/mental health.  She additionally has issues of chronic nausea and states that in the past she was on omeprazole 40 mg twice daily for acid reflux/GERD and also took Compazine to help with her nausea.  She is chronically been on Ambien 10 mg to help with sleep as she states without the use of Ambien she cannot sleep.  She reports that she currently has refills of the Ambien.         On review of systems, she denies any issues with headaches or dizziness, no changes in vision or hearing, no sore throat or difficulty swallowing, no shortness of breath or cough, no chest pain or palpitations, no abdominal pain-no constipation or diarrhea.  She reports chronic issues with nausea and acid reflux symptoms.  No blood in the stool or black stools.  No urinary urgency, frequency or dysuria.  No peripheral edema.  No suicidal thoughts or ideations.   Past Medical History:  Diagnosis Date  . Anxiety   . Back pain   . Cancer ([place])    hodkings  lymphoma 2016  . Depression   . GERD (gastroesophageal reflux disease)   . Hypertension     Past Surgical History:  Procedure Laterality Date  . BACK SURGERY    . BREAST SURGERY    . LEG SURGERY      No family history on file.  Social History   Tobacco Use  . Smoking status: Never Smoker  . Smokeless tobacco: Never Used  Substance Use Topics  . Alcohol use: Not Currently  . Drug use: Never     Allergies  Allergen Reactions  . Erythromycin Hives  . Tramadol Hives       Observations/Objective: No vital signs or physical exam conducted as visit was done via  telephone  Assessment and Plan: 1. Essential hypertension She reports that she is currently on atenolol 50 mg for hypertension and her blood pressure is controlled and stable.  She does however need refill of the atenolol.  Refill sent to patient's pharmacy. -Atenolol 50 mg 1 p.o. daily #90 with 1 refill  2. Gastroesophageal reflux disease, unspecified whether esophagitis present She reports continued issues with acid reflux which has been a chronic problem.  She reports past use of omeprazole 40 mg twice daily and new prescription provided and patient has been asked to follow-up in 6 weeks to make sure that this has been effective. -Omeprazole 40 mg twice daily, dispense #180 with 1 refill  3. Chronic pain after cancer treatment She reports chronic neuropathic pain status post chemotherapy for Hodgkin's lymphoma.  She finds that her current gabapentin is no longer as effective.  She also has issues with chronic low back pain with radiation to the left leg.  Referral is being placed for patient to follow-up with pain management. -Ambulatory referral to pain medicine  4. History of Hodgkin's lymphoma Patient reports history of Hodgkin's lymphoma diagnosed at age 21 and treated with chemotherapy.  She missed her last follow-up with hematology oncology which she reports so that she is now about 6 months past due for follow-up.  She will be referred to establish with hematology oncology and she would like to have follow-up done in [facility]. -Ambulatory referral to hematology/oncology  5. Nausea She reports issues with chronic nausea as well as GERD.  Refills provided for omeprazole 40 mg twice daily and prescription for Compazine 10 mg #30 with no refill.  6. Anxiety and depression Refill provided of Lexapro.  Referral placed to psychiatry and patient was made aware that in the interim, referral also placed to social worker at this office to contact patient to help with resources for follow-up  with mental health. -Ambulatory referral to psychiatry -Ambulatory referral to mental health -Refill Lexapro 20 mg 1 p.o. daily; dispense #90 with no refill  7. Chronic insomnia She reports issues of chronic insomnia for which she is currently on Ambien.  PDM are reviewed.  She reports that she currently has adequate refills.  8. Chronic left lumbar radiculopathy She reports history of back surgery but has continued chronic left lumbar radiculopathy.  She will be referred to pain medicine for continued treatment.  Follow Up Instructions:Return in about 6 weeks (around 03/26/2019) for Chronic issues.    I discussed the assessment and treatment plan with the patient. The patient was provided an opportunity to ask questions and all were answered. The patient agreed with the plan and demonstrated an understanding of the instructions.   The patient was advised to call back or seek an in-person evaluation if the symptoms worsen  or if the condition fails to improve as anticipated.  I provided 23 minutes of non-face-to-face time during this encounter. Additional time spent reviewing available past records in epic, placing orders for follow-up as well as medications and completing family history/surgical history.  Antony Blackbird, MD

## 2019-03-05 ENCOUNTER — Telehealth: Payer: Self-pay | Admitting: Family Medicine

## 2019-03-05 NOTE — Telephone Encounter (Signed)
Patient called and requested for listed medication to be refilled due to her not being able to get an appointment with pain management until the end of the month. Please follow up at your earliest convenience.  Gabapentin.

## 2019-03-07 ENCOUNTER — Other Ambulatory Visit: Payer: Self-pay | Admitting: Family Medicine

## 2019-03-07 DIAGNOSIS — R110 Nausea: Secondary | ICD-10-CM

## 2019-03-07 DIAGNOSIS — M5416 Radiculopathy, lumbar region: Secondary | ICD-10-CM

## 2019-03-07 DIAGNOSIS — K219 Gastro-esophageal reflux disease without esophagitis: Secondary | ICD-10-CM

## 2019-03-07 MED ORDER — GABAPENTIN 300 MG PO CAPS: 300.0000 mg | ORAL_CAPSULE | Freq: Three times a day (TID) | ORAL | 1 refills | Status: DC

## 2019-03-07 NOTE — Telephone Encounter (Signed)
DOB and name verified. Called pt made aware of sent Rx / Stated her previous Md gave 1 tablet X 400mg  in the morning and 2X 400mg  at night / She will f/u with pain management as well.

## 2019-03-07 NOTE — Telephone Encounter (Signed)
Please update in patient's chart but she can take the current prescription until she can follow-up with pain management

## 2019-03-07 NOTE — Progress Notes (Signed)
Patient ID: Consepcion Hearing, female   DOB: 1985/02/23, 34 y.o.   MRN: [id number]   Phone message received that patient would like refill of gabapentin as she has not yet been able to see pain management.  Prescription provided for gabapentin 300 mg 3 times daily as the instructions were not clear on current chart.

## 2019-03-07 NOTE — Telephone Encounter (Signed)
Please clarify gabapentin dose with the patient and correct in chart if needed.  Please notify patient that a prescription was sent to her pharmacy in [place] for gabapentin 300 mg 3 times daily #90 with 1 refill

## 2019-03-27 ENCOUNTER — Ambulatory Visit: Payer: Medicaid Other | Admitting: Family Medicine

## 2019-03-31 ENCOUNTER — Encounter: Payer: Self-pay | Admitting: Family Medicine

## 2019-03-31 ENCOUNTER — Other Ambulatory Visit: Payer: Self-pay

## 2019-03-31 ENCOUNTER — Ambulatory Visit: Payer: Medicaid Other | Attending: Family Medicine | Admitting: Family Medicine

## 2019-03-31 DIAGNOSIS — F329 Major depressive disorder, single episode, unspecified: Secondary | ICD-10-CM

## 2019-03-31 DIAGNOSIS — Z8571 Personal history of Hodgkin lymphoma: Secondary | ICD-10-CM

## 2019-03-31 DIAGNOSIS — M25562 Pain in left knee: Secondary | ICD-10-CM

## 2019-03-31 DIAGNOSIS — F32A Depression, unspecified: Secondary | ICD-10-CM

## 2019-03-31 DIAGNOSIS — G8929 Other chronic pain: Secondary | ICD-10-CM

## 2019-03-31 DIAGNOSIS — M5416 Radiculopathy, lumbar region: Secondary | ICD-10-CM

## 2019-03-31 DIAGNOSIS — F419 Anxiety disorder, unspecified: Secondary | ICD-10-CM

## 2019-03-31 DIAGNOSIS — M25561 Pain in right knee: Secondary | ICD-10-CM

## 2019-03-31 DIAGNOSIS — F5104 Psychophysiologic insomnia: Secondary | ICD-10-CM

## 2019-03-31 NOTE — Progress Notes (Signed)
Virtual Visit via Telephone Note  I connected with Rachel Wilkerson on 03/31/19 at  3:10 PM EDT by telephone and verified that I am speaking with the correct person using two identifiers.   I discussed the limitations, risks, security and privacy concerns of performing an evaluation and management service by telephone and the availability of in person appointments. I also discussed with the patient that there may be a patient responsible charge related to this service. The patient expressed understanding and agreed to proceed.  Patient Location: Home Provider Location: [facility] Others participating in call: call initiated by Emilio Aspen, RMA who then transferred the call to me   History of Present Illness:          34 year old female who is status post new patient visit on 02/12/2019 to establish care.  She reports continued issues with chronic neuropathic pain after treatment for Hodgkin's lymphoma as well as chronic low back pain with radiation.  She reports that she had an appointment with pain management but missed the appointment due to issues with her car.  She reports that she needs to come into the office at some point and have a urine drug screen since she states that she was unable to make it to her pain management appointment to perform urine drug screen.  At today's visit, she request referral to orthopedics due to issues with bilateral nodules in her legs above her knees and other places in her legs which caused her to have chronic pain in her legs which is worse with prolonged walking/activity.  If she has been walking for a while then her knee pain is about a 6 on a 0-to-10 scale.  She reports that her low back pain with radiation is about a 6-8 on a 0-to-10 scale and worse with activity.          She also reports that she has had chronic issues with insomnia since she was treated for Hodgkin's lymphoma.  She reports the need for a different medication as she feels that the  zolpidem that she was prescribed about 5 years ago is no longer as effective to help with her sleep.  She also requests referral to establish with an oncologist in follow-up of her history of Hodgkin's lymphoma.         She reports continued issues with anxiety and depression especially since moving as she is no longer near her family for support.  She was not aware that she has upcoming appointment with behavioral health.  She denies suicidal thoughts or ideations at this time.  She does not have any other concerns at today's visit.   Past Medical History:  Diagnosis Date  . Anxiety   . Back pain   . Cancer ([place])    hodkings lymphoma 2016  . Depression   . GERD (gastroesophageal reflux disease)   . Hypertension     Past Surgical History:  Procedure Laterality Date  . BACK SURGERY  2012  . BREAST SURGERY    . LEG SURGERY      Family History  Problem Relation Age of Onset  . Diabetes Mother   . Schizophrenia Mother   . Bipolar disorder Mother   . Dementia Mother   . Hypertension Father   . Non-Hodgkin's lymphoma Paternal Grandfather   . Heart attack Paternal Grandfather     Social History   Tobacco Use  . Smoking status: Never Smoker  . Smokeless tobacco: Never Used  Substance Use Topics  .  Alcohol use: Not Currently  . Drug use: Never     Allergies  Allergen Reactions  . Erythromycin Hives  . Tramadol Hives       Observations/Objective: No vital signs or physical exam conducted as visit was done via telephone  Assessment and Plan: 1. Chronic left lumbar radiculopathy New referral will be placed for patient to follow-up with pain management.  She was previously referred to [facility] but was unable to be seen at that office.  Per message from [facility], patient had taken Suboxone given to her by a friend prior to the appointment and failed a drug screen and therefore will not be seen for further evaluation and treatment until she has a  clean urine drug screen at her primary care provider's office. - Ambulatory referral to Pain Clinic  2. History of Hodgkin's lymphoma She reports a history of Hodgkin's lymphoma for which she needs to be referred to a local oncologist for further evaluation and treatment.  Referral was placed and patient was asked to contact this office next week if she has not heard anything about her referral appointment by that time as hematology oncology referral was placed at her last visit - Ambulatory referral to Hematology / Oncology  3. Bilateral chronic knee pain She reports a history of bilateral chronic knee pain/leg pain due to nodules in her legs and knee area.  She requests referral to orthopedics referral is being placed and she is to call the office next week if she has not gotten any information back regarding her orthopedic referral. - AMB referral to orthopedics  4. Chronic insomnia She reports that she has issues with chronic insomnia and was started on zolpidem about 5 years ago while she was undergoing treatment for her Hodgkin's lymphoma.  She thinks that the medication is now not as effective and she wonders if the medication can be changed.  Discussed with the patient that she should bring her sleep issues up when she sees the behavioral health specialist a psychiatrist in a few days as I do not wish to change her medication and place her on something sedating that what interfere with her therapy that she may receive from psychiatry/mental health provider.  5. Anxiety and depression Patient with abnormal PHQ-9 score and admits to history of anxiety and depression.  Patient reports that she was not aware of upcoming appointment on 04/02/2019 with [facility] behavioral health, Dr. Adele Schilder which is in patient's chart.  Patient will be contacted by CMA with contact number for this office so that she can confirm her appointment.  Follow Up Instructions:Return in about 12 weeks (around 06/23/2019)  for Chronic issues.   This note is not being shared with the patient for the following reason: Sensitive nature of her ongoing issues with chronic pain with self treatment and anxiety/depression  I discussed the assessment and treatment plan with the patient. The patient was provided an opportunity to ask questions and all were answered. The patient agreed with the plan and demonstrated an understanding of the instructions.   The patient was advised to call back or seek an in-person evaluation if the symptoms worsen or if the condition fails to improve as anticipated.  I provided 11 minutes of non-face-to-face time during this encounter.   Antony Blackbird, MD

## 2019-03-31 NOTE — Progress Notes (Signed)
Referral for cancer doctor and need to switch some meds around

## 2019-04-02 ENCOUNTER — Encounter (HOSPITAL_COMMUNITY): Payer: Self-pay | Admitting: Psychiatry

## 2019-04-02 ENCOUNTER — Telehealth: Payer: Self-pay | Admitting: Licensed Clinical Social Worker

## 2019-04-02 ENCOUNTER — Ambulatory Visit (INDEPENDENT_AMBULATORY_CARE_PROVIDER_SITE_OTHER): Payer: Medicaid Other | Admitting: Psychiatry

## 2019-04-02 ENCOUNTER — Other Ambulatory Visit: Payer: Self-pay

## 2019-04-02 VITALS — Wt 215.0 lb

## 2019-04-02 DIAGNOSIS — F32A Depression, unspecified: Secondary | ICD-10-CM | POA: Insufficient documentation

## 2019-04-02 DIAGNOSIS — C801 Malignant (primary) neoplasm, unspecified: Secondary | ICD-10-CM | POA: Insufficient documentation

## 2019-04-02 DIAGNOSIS — F4310 Post-traumatic stress disorder, unspecified: Secondary | ICD-10-CM | POA: Diagnosis not present

## 2019-04-02 DIAGNOSIS — F5101 Primary insomnia: Secondary | ICD-10-CM | POA: Diagnosis not present

## 2019-04-02 DIAGNOSIS — E669 Obesity, unspecified: Secondary | ICD-10-CM | POA: Insufficient documentation

## 2019-04-02 DIAGNOSIS — F329 Major depressive disorder, single episode, unspecified: Secondary | ICD-10-CM | POA: Insufficient documentation

## 2019-04-02 DIAGNOSIS — F331 Major depressive disorder, recurrent, moderate: Secondary | ICD-10-CM | POA: Diagnosis not present

## 2019-04-02 DIAGNOSIS — K219 Gastro-esophageal reflux disease without esophagitis: Secondary | ICD-10-CM | POA: Insufficient documentation

## 2019-04-02 MED ORDER — VENLAFAXINE HCL 37.5 MG PO TABS: 37.5000 mg | ORAL_TABLET | Freq: Two times a day (BID) | ORAL | 0 refills | Status: DC

## 2019-04-02 MED ORDER — ZOLPIDEM TARTRATE ER 12.5 MG PO TBCR: 12.5000 mg | EXTENDED_RELEASE_TABLET | Freq: Every evening | ORAL | 1 refills | Status: DC | PRN

## 2019-04-02 NOTE — Telephone Encounter (Signed)
Call placed to patient regarding [facility] referral. There was no option for LCSW to leave message.
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
## 2019-04-09 ENCOUNTER — Ambulatory Visit: Payer: Medicaid Other | Admitting: Orthopaedic Surgery

## 2019-04-14 ENCOUNTER — Telehealth: Payer: Self-pay | Admitting: *Deleted

## 2019-04-14 NOTE — Telephone Encounter (Signed)
Informed patient that she will need to come to office to complete release of information to get her previous oncology records so that we can send those records to the new oncology office so that she can get an appt with them. Patient verbalized understanding and stated she will come by office to complete release of information.

## 2019-04-15 ENCOUNTER — Ambulatory Visit: Payer: Medicaid Other | Admitting: Orthopaedic Surgery

## 2019-04-22 ENCOUNTER — Ambulatory Visit: Payer: Self-pay

## 2019-04-22 ENCOUNTER — Ambulatory Visit (INDEPENDENT_AMBULATORY_CARE_PROVIDER_SITE_OTHER): Payer: Medicaid Other

## 2019-04-22 ENCOUNTER — Other Ambulatory Visit: Payer: Self-pay

## 2019-04-22 ENCOUNTER — Encounter: Payer: Self-pay | Admitting: Orthopaedic Surgery

## 2019-04-22 ENCOUNTER — Ambulatory Visit (INDEPENDENT_AMBULATORY_CARE_PROVIDER_SITE_OTHER): Payer: Medicaid Other | Admitting: Orthopaedic Surgery

## 2019-04-22 VITALS — Ht 64.0 in | Wt 202.0 lb

## 2019-04-22 DIAGNOSIS — M25561 Pain in right knee: Secondary | ICD-10-CM

## 2019-04-22 DIAGNOSIS — M25562 Pain in left knee: Secondary | ICD-10-CM

## 2019-04-22 DIAGNOSIS — M25532 Pain in left wrist: Secondary | ICD-10-CM

## 2019-04-22 DIAGNOSIS — G8929 Other chronic pain: Secondary | ICD-10-CM

## 2019-04-22 NOTE — Progress Notes (Signed)
Office Visit Note   Patient: Rachel Wilkerson           Date of Birth: 1985/12/24           MRN: [id number] Kardell:[id number] Visit Date: 04/22/2019              Requested by: Antony Blackbird, MD [place],  [place] [id number] PCP: Antony Blackbird, MD   Assessment & Plan: Visit Diagnoses:  1. Chronic pain of left knee   2. Chronic pain of right knee   3. 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  At this point, we will refer her to Sullivan Lone and Magda Bernheim at [facility] for further evaluation treatment recommendation.  She will follow up with [place] as needed.  Follow-Up Instructions: Return if symptoms worsen or fail to improve.   Orders:  Orders Placed This Encounter  Procedures  . XR Knee 1-2 Views Left  . XR Knee 1-2 Views Right  . XR Wrist Complete Left   No orders of the defined types were placed in this encounter.     Procedures: No procedures performed   Clinical Data: No additional findings.   Subjective: Chief Complaint  Patient presents with  . Right Knee - Pain  . 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  She notes that she has already had 3 of these removed.  She had one in her left femur removed at the age of 11, right tibia removal at age 12 and right ankle removal at age 31.  She also notes that her father and daughter have these as well.  She is currently 5 years in remission from stage III Hodgkin's lymphoma.  She is here today for symptomatic osteochondromas to the medial thigh and most recently left wrist pain.  She has had symptoms to the left medial thighs for a while now.  She has constant pain but also complains of numbness to both legs at times when she is sitting and driving.  She  denies any previous MRI of these areas.  In regards to the left wrist, she noticed pain to the distal radius over the past few days.  No pain to the ulna.  Her pain is worse with wrist extension.  No numbness, tingling or burning.  No fevers or chills or any other systemic symptoms.  Review of Systems as detailed in HPI.  All others reviewed and are negative.   Objective: Vital Signs: Ht 5\' 4"  (1.626 m)   Wt 202 lb (91.6 kg)   BMI 34.67 kg/m   Physical Exam well-developed well-nourished female no acute distress.  Alert and oriented x3.  Ortho Exam examination of both thighs reveals mild to moderate tenderness to the medial aspect.  No limitation in range of motion.  No focal weakness.  Left wrist exam shows moderate tenderness to the distal radius.  No tenderness to the ulna.  Increased pain with wrist extension.  She is neurovascular intact distally.  Specialty Comments:  No specialty comments available.  Imaging: XR Knee 1-2 Views Left  Result Date: 04/22/2019 Likely osteochondroma medial femur, medial tibia and fibular head  XR Wrist Complete Left  Result Date: 04/22/2019 Questionable osteochondroma mid to distal ulna  XR  Knee 1-2 Views Right  Result Date: 04/22/2019 Osteochondroma medial femur and tibia    PMFS History: Patient Active Problem List   Diagnosis Date Noted  . Obese 04/02/2019  . GERD (gastroesophageal reflux disease) 04/02/2019  . Cancer ([place]) 04/02/2019  . Depression 04/02/2019  . Anxiety 11/26/2015   Past Medical History:  Diagnosis Date  . Anxiety   . Back pain   . Cancer ([place])    hodkings lymphoma 2016  . Depression   . GERD (gastroesophageal reflux disease)   . Hypertension     Family History  Problem Relation Age of Onset  . Diabetes Mother   . Schizophrenia Mother   . Bipolar disorder Mother   . Dementia Mother   . Hypertension Father   . Non-Hodgkin's lymphoma Paternal Grandfather   . Heart attack Paternal Grandfather     Past  Surgical History:  Procedure Laterality Date  . BACK SURGERY  2012  . BREAST SURGERY    . LEG SURGERY     Social History   Occupational History  . Not on file  Tobacco Use  . Smoking status: Never Smoker  . Smokeless tobacco: Never Used  Substance and Sexual Activity  . Alcohol use: Not Currently  . Drug use: Never  . Sexual activity: Not on file

## 2019-04-23 ENCOUNTER — Telehealth: Payer: Self-pay | Admitting: Licensed Clinical Social Worker

## 2019-04-23 ENCOUNTER — Encounter (HOSPITAL_COMMUNITY): Payer: Self-pay | Admitting: Psychiatry

## 2019-04-23 ENCOUNTER — Telehealth (INDEPENDENT_AMBULATORY_CARE_PROVIDER_SITE_OTHER): Payer: Medicaid Other | Admitting: Psychiatry

## 2019-04-23 DIAGNOSIS — F4310 Post-traumatic stress disorder, unspecified: Secondary | ICD-10-CM | POA: Diagnosis not present

## 2019-04-23 DIAGNOSIS — F5101 Primary insomnia: Secondary | ICD-10-CM | POA: Diagnosis not present

## 2019-04-23 DIAGNOSIS — F331 Major depressive disorder, recurrent, moderate: Secondary | ICD-10-CM

## 2019-04-23 MED ORDER — VENLAFAXINE HCL 37.5 MG PO TABS: 112.5000 mg | ORAL_TABLET | Freq: Every day | ORAL | 1 refills | Status: DC
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
## 2019-04-23 NOTE — Telephone Encounter (Signed)
Call placed to patient. LCSW left message requesting a return call.  

## 2019-04-24 ENCOUNTER — Encounter: Payer: Self-pay | Admitting: Family Medicine

## 2019-04-24 NOTE — Addendum Note (Signed)
Addended by: Precious Bard on: 04/24/2019 03:06 PM   Modules accepted: Orders

## 2019-04-30 ENCOUNTER — Ambulatory Visit (INDEPENDENT_AMBULATORY_CARE_PROVIDER_SITE_OTHER)
Admission: RE | Admit: 2019-04-30 | Discharge: 2019-04-30 | Disposition: A | Discharge status: Home / Self Care | Payer: Medicaid Other | Source: Ambulatory Visit

## 2019-04-30 DIAGNOSIS — M545 Low back pain, unspecified: Secondary | ICD-10-CM

## 2019-04-30 MED ORDER — CYCLOBENZAPRINE HCL 10 MG PO TABS: 10.0000 mg | ORAL_TABLET | Freq: Two times a day (BID) | ORAL | 0 refills | Status: DC | PRN

## 2019-04-30 MED ORDER — IBUPROFEN 800 MG PO TABS: 800.0000 mg | ORAL_TABLET | Freq: Three times a day (TID) | ORAL | 0 refills | Status: DC | PRN

## 2019-04-30 NOTE — ED Provider Notes (Signed)
Virtual Visit via Video Note:  Shiryl R Siemen  initiated request for Telemedicine visit with [facility] team. I connected with Anabeth R Lunn  on 04/30/2019 at 3:13 PM  for a synchronized telemedicine visit using a video enabled HIPPA compliant telemedicine application. I verified that I am speaking with [name]  using two identifiers. Sharion Balloon, NP  was physically located in a [facility] Urgent care site and Denessa R Kindle was located at a different location.   The limitations of evaluation and management by telemedicine as well as the availability of in-person appointments were discussed. Patient was informed that she  may incur a bill ( including co-pay) for this virtual visit encounter. Arantxa R Limburg  expressed understanding and gave verbal consent to proceed with virtual visit.     History of Present Illness:Rachel Wilkerson  is a 34 y.o. female presents for evaluation of muscle pain in her left lower back x 1 day.  No known injury. She reports a history of back surgery in 2013 for DDD.  She denies numbness or weakness in LE.  No bruising, redness, lesions, rash, or other symptoms.  No abdominal pain, dysuria, vaginal symptoms.  She denies pregnancy or breastfeeding.     Allergies  Allergen Reactions  . Erythromycin Hives  . Tramadol Hives     Past Medical History:  Diagnosis Date  . Anxiety   . Back pain   . Cancer ([place])    hodkings lymphoma 2016  . Depression   . GERD (gastroesophageal reflux disease)   . Hypertension      Social History   Tobacco Use  . Smoking status: Never Smoker  . Smokeless tobacco: Never Used  Substance Use Topics  . Alcohol use: Not Currently  . Drug use: Never   ROS: as stated in HPI.  All other systems reviewed and negative.      Observations/Objective: Physical Exam  VITALS: Patient denies fever. GENERAL: Alert, appears well and in no acute distress. HEENT: Atraumatic. NECK: Normal movements of the head  and neck. CARDIOPULMONARY: No increased WOB. Speaking in clear sentences. I:E ratio WNL.  MS: Moves all visible extremities without noticeable abnormality. PSYCH: Pleasant and cooperative, well-groomed. Speech normal rate and rhythm. Affect is appropriate. Insight and judgement are appropriate. Attention is focused, linear, and appropriate.  NEURO: CN grossly intact. Oriented as arrived to appointment on time with no prompting. Moves both UE equally.  SKIN: No obvious lesions, wounds, erythema, or cyanosis noted on face or hands.   Assessment and Plan:    ICD-10-CM   1. Acute left-sided low back pain without sciatica  M54.5        Follow Up Instructions: Treating with ibuprofen and Flexeril.  Precautions for drowsiness with Flexeril discussed with patient.  Instructed her to follow-up with her PCP if her symptoms or not improving.  Patient agrees to plan of care.      I discussed the assessment and treatment plan with the patient. The patient was provided an opportunity to ask questions and all were answered. The patient agreed with the plan and demonstrated an understanding of the instructions.   The patient was advised to call back or seek an in-person evaluation if the symptoms worsen or if the condition fails to improve as anticipated.      Sharion Balloon, NP  04/30/2019 3:13 PM         Sharion Balloon, NP 04/30/19 1513

## 2019-04-30 NOTE — Discharge Instructions (Addendum)
Take the prescribed ibuprofen as needed for your pain.  Take the muscle relaxer Flexeril as needed for muscle spasm; do not drive, operate machinery, or drink alcohol with this medication as it may make you drowsy.    Follow up with your primary care provider if your symptoms are not improving.

## 2019-05-03 ENCOUNTER — Other Ambulatory Visit: Payer: Self-pay | Admitting: Family Medicine

## 2019-05-03 DIAGNOSIS — M5416 Radiculopathy, lumbar region: Secondary | ICD-10-CM

## 2019-05-08 ENCOUNTER — Telehealth (HOSPITAL_COMMUNITY): Payer: Self-pay

## 2019-05-08 NOTE — Telephone Encounter (Signed)
[place] TRACKS PRESCRIPTION COVERAGE DENIED  ZOLPIDEM CR12.5MG  TABLET REFERENCE# [id number]  SPOKE W/PATSY. SHE STATED REASON FOR DENIAL WAS THAT PT HAS NOT TRIED & FAILED 2 OR MORE MED IN THE SAME CLASS

## 2019-05-08 NOTE — Telephone Encounter (Signed)
She did try the Ambien instant release that did not help her as much.  You can check with the patient if she like to try Lunesta 2 mg since Ambien CR not improved.  If she agree please call the pharmacy Lunesta 2 mg to take at bedtime.

## 2019-05-09 ENCOUNTER — Other Ambulatory Visit (HOSPITAL_COMMUNITY): Payer: Self-pay | Admitting: Psychiatry

## 2019-05-09 DIAGNOSIS — F4310 Post-traumatic stress disorder, unspecified: Secondary | ICD-10-CM

## 2019-05-09 DIAGNOSIS — F331 Major depressive disorder, recurrent, moderate: Secondary | ICD-10-CM

## 2019-05-09 NOTE — Telephone Encounter (Signed)
Yes, I annotated that on the form when I faxed it in that she had tried & failed the Ambien instant release but they wanted her to try more meds in the same class. However, I called her to see if she wants to try Lunesta 2mg  and she didn't pick up. LVM to call me back and let me know what she wants to do. Thank you.

## 2019-05-18 ENCOUNTER — Other Ambulatory Visit: Payer: Self-pay | Admitting: Family Medicine

## 2019-05-18 DIAGNOSIS — R110 Nausea: Secondary | ICD-10-CM

## 2019-05-18 DIAGNOSIS — K219 Gastro-esophageal reflux disease without esophagitis: Secondary | ICD-10-CM

## 2019-06-05 ENCOUNTER — Telehealth (HOSPITAL_COMMUNITY): Payer: Medicaid Other | Admitting: Psychiatry

## 2019-07-05 ENCOUNTER — Other Ambulatory Visit: Payer: Self-pay | Admitting: Family Medicine

## 2019-07-05 DIAGNOSIS — M5416 Radiculopathy, lumbar region: Secondary | ICD-10-CM

## 2019-07-05 NOTE — Telephone Encounter (Signed)
Requested Prescriptions  Pending Prescriptions Disp Refills  . gabapentin (NEURONTIN) 300 MG capsule [Pharmacy Med Name: GABAPENTIN 300 MG CAPSULE] 90 capsule 1    Sig: TAKE 1 CAPSULE BY MOUTH THREE TIMES A DAY     Neurology: Anticonvulsants - gabapentin Passed - 07/05/2019 10:06 AM      Passed - Valid encounter within last 12 months    Recent Outpatient Visits          3 months ago Chronic left lumbar radiculopathy   [facility], [place], MD   4 months ago Essential hypertension   [facility] [place], [place], [place]

## 2019-07-11 ENCOUNTER — Other Ambulatory Visit: Payer: Self-pay | Admitting: Family Medicine

## 2019-07-11 DIAGNOSIS — K219 Gastro-esophageal reflux disease without esophagitis: Secondary | ICD-10-CM

## 2019-07-11 DIAGNOSIS — R110 Nausea: Secondary | ICD-10-CM

## 2019-07-11 NOTE — Telephone Encounter (Signed)
Requested medication (s) are due for refill today: Yes  Requested medication (s) are on the active medication list: Yes  Last refill:  05/19/19  Future visit scheduled: No  Notes to clinic:  See request.    Requested Prescriptions  Pending Prescriptions Disp Refills   prochlorperazine (COMPAZINE) 5 MG tablet [Pharmacy Med Name: PROCHLORPERAZINE 5 MG TABLET] 30 tablet 2    Sig: TAKE 1 TABLET (5 MG TOTAL) BY MOUTH EVERY 6 (SIX) HOURS AS NEEDED FOR NAUSEA OR VOMITING.      Not Delegated - Gastroenterology: Antiemetics Failed - 07/11/2019  7:40 AM      Failed - This refill cannot be delegated      Passed - Valid encounter within last 6 months    Recent Outpatient Visits           3 months ago Chronic left lumbar radiculopathy   [place], MD   4 months ago Essential hypertension   [facility] [place], [place], MD

## 2019-07-30 ENCOUNTER — Ambulatory Visit: Payer: Self-pay

## 2019-07-30 NOTE — Telephone Encounter (Signed)
Patient called and says she's been having problems with her breathing for the past week. She denies SOB at this time, but says this morning when she woke up she had the urge to take a really deep breath. She says she called her psychiatrist and was told to call her PCP for evaluation. She says this feeling to take a deep breath happens in the mornings and mid-day. She says sometimes she's sitting and feel like her breath is being cut off and takes a deep breath. She denies CP or any other symptoms. She also says she needs to see the doctor about a toothache that she thinks she will need antibiotics for. I advised no availability with PCP or any other provider in the office for the next few weeks, Advised to go to UC, she says she will go there.  Reason for Disposition . [1] MILD longstanding difficulty breathing AND [2]  SAME as normal  Answer Assessment - Initial Assessment Questions 1. RESPIRATORY STATUS: "Describe your breathing?" (e.g., wheezing, shortness of breath, unable to speak, severe coughing)      Take in a really deep breath because my air feels like it's being cut off 2. ONSET: "When did this breathing problem begin?"      1 week ago 3. PATTERN "Does the difficult breathing come and go, or has it been constant since it started?"      Come and go, worse in the morning and middle of the day 4. SEVERITY: "How bad is your breathing?" (e.g., mild, moderate, severe)    - MILD: No SOB at rest, mild SOB with walking, speaks normally in sentences, can lay down, no retractions, pulse < 100.    - MODERATE: SOB at rest, SOB with minimal exertion and prefers to sit, cannot lie down flat, speaks in phrases, mild retractions, audible wheezing, pulse 100-120.    - SEVERE: Very SOB at rest, speaks in single words, struggling to breathe, sitting hunched forward, retractions, pulse > 120      Mild 5. RECURRENT SYMPTOM: "Have you had difficulty breathing before?" If Yes, ask: "When was the last time?" and  "What happened that time?"      No 6. CARDIAC HISTORY: "Do you have any history of heart disease?" (e.g., heart attack, angina, bypass surgery, angioplasty)     No 7. LUNG HISTORY: "Do you have any history of lung disease?"  (e.g., pulmonary embolus, asthma, emphysema)     No 8. CAUSE: "What do you think is causing the breathing problem?"      No clue 9. OTHER SYMPTOMS: "Do you have any other symptoms? (e.g., dizziness, runny nose, cough, chest pain, fever)     No 10. PREGNANCY: "Is there any chance you are pregnant?" "When was your last menstrual period?"      No; LMP last month 11. TRAVEL: "Have you traveled out of the country in the last month?" (e.g., travel history, exposures)       No  Protocols used: BREATHING DIFFICULTY-A-AH

## 2019-08-15 ENCOUNTER — Other Ambulatory Visit: Payer: Self-pay | Admitting: Family Medicine

## 2019-08-15 DIAGNOSIS — I10 Essential (primary) hypertension: Secondary | ICD-10-CM

## 2019-09-14 ENCOUNTER — Other Ambulatory Visit: Payer: Self-pay | Admitting: Family Medicine

## 2019-09-14 DIAGNOSIS — I10 Essential (primary) hypertension: Secondary | ICD-10-CM

## 2019-09-14 DIAGNOSIS — K219 Gastro-esophageal reflux disease without esophagitis: Secondary | ICD-10-CM

## 2019-09-29 ENCOUNTER — Ambulatory Visit (HOSPITAL_BASED_OUTPATIENT_CLINIC_OR_DEPARTMENT_OTHER): Payer: Medicaid Other | Admitting: Pharmacist

## 2019-09-29 ENCOUNTER — Ambulatory Visit: Payer: Medicaid Other | Attending: Critical Care Medicine | Admitting: Critical Care Medicine

## 2019-09-29 ENCOUNTER — Other Ambulatory Visit: Payer: Self-pay

## 2019-09-29 ENCOUNTER — Encounter: Payer: Self-pay | Admitting: Critical Care Medicine

## 2019-09-29 VITALS — BP 160/101 | HR 80 | Temp 98.3°F | Resp 16 | Ht 64.0 in | Wt 201.4 lb

## 2019-09-29 DIAGNOSIS — Z1159 Encounter for screening for other viral diseases: Secondary | ICD-10-CM

## 2019-09-29 DIAGNOSIS — Z9221 Personal history of antineoplastic chemotherapy: Secondary | ICD-10-CM | POA: Diagnosis not present

## 2019-09-29 DIAGNOSIS — F1190 Opioid use, unspecified, uncomplicated: Secondary | ICD-10-CM | POA: Insufficient documentation

## 2019-09-29 DIAGNOSIS — F1120 Opioid dependence, uncomplicated: Secondary | ICD-10-CM

## 2019-09-29 DIAGNOSIS — Z8571 Personal history of Hodgkin lymphoma: Secondary | ICD-10-CM | POA: Insufficient documentation

## 2019-09-29 DIAGNOSIS — I10 Essential (primary) hypertension: Secondary | ICD-10-CM | POA: Diagnosis present

## 2019-09-29 DIAGNOSIS — E669 Obesity, unspecified: Secondary | ICD-10-CM | POA: Diagnosis not present

## 2019-09-29 DIAGNOSIS — C8190 Hodgkin lymphoma, unspecified, unspecified site: Secondary | ICD-10-CM

## 2019-09-29 DIAGNOSIS — G2581 Restless legs syndrome: Secondary | ICD-10-CM | POA: Insufficient documentation

## 2019-09-29 DIAGNOSIS — F5101 Primary insomnia: Secondary | ICD-10-CM | POA: Diagnosis not present

## 2019-09-29 DIAGNOSIS — F3341 Major depressive disorder, recurrent, in partial remission: Secondary | ICD-10-CM | POA: Insufficient documentation

## 2019-09-29 DIAGNOSIS — K219 Gastro-esophageal reflux disease without esophagitis: Secondary | ICD-10-CM | POA: Insufficient documentation

## 2019-09-29 DIAGNOSIS — Z79899 Other long term (current) drug therapy: Secondary | ICD-10-CM | POA: Diagnosis not present

## 2019-09-29 DIAGNOSIS — F1199 Opioid use, unspecified with unspecified opioid-induced disorder: Secondary | ICD-10-CM

## 2019-09-29 DIAGNOSIS — Z114 Encounter for screening for human immunodeficiency virus [HIV]: Secondary | ICD-10-CM

## 2019-09-29 DIAGNOSIS — Z23 Encounter for immunization: Secondary | ICD-10-CM | POA: Diagnosis not present

## 2019-09-29 DIAGNOSIS — F419 Anxiety disorder, unspecified: Secondary | ICD-10-CM | POA: Insufficient documentation

## 2019-09-29 MED ORDER — LOSARTAN POTASSIUM-HCTZ 100-12.5 MG PO TABS: 1.0000 | ORAL_TABLET | Freq: Every day | ORAL | 3 refills | Status: AC

## 2019-09-29 MED ORDER — PRAMIPEXOLE DIHYDROCHLORIDE 0.25 MG PO TABS: 0.2500 mg | ORAL_TABLET | Freq: Every day | ORAL | 2 refills | Status: DC

## 2019-09-29 NOTE — Patient Instructions (Addendum)
Influenza Virus Vaccine injection (Fluarix) What is this medicine? INFLUENZA VIRUS VACCINE (in floo EN zuh VAHY ruhs vak SEEN) helps to reduce the risk of getting influenza also known as the flu. This medicine may be used for other purposes; ask your health care provider or pharmacist if you have questions. COMMON BRAND NAME(S): Fluarix, Fluzone What should I tell my health care provider before I take this medicine? They need to know if you have any of these conditions:  bleeding disorder like hemophilia  fever or infection  Guillain-Barre syndrome or other neurological problems  immune system problems  infection with the human immunodeficiency virus (HIV) or AIDS  low blood platelet counts  multiple sclerosis  an unusual or allergic reaction to influenza virus vaccine, eggs, chicken proteins, latex, gentamicin, other medicines, foods, dyes or preservatives  pregnant or trying to get pregnant  breast-feeding How should I use this medicine? This vaccine is for injection into a muscle. It is given by a health care professional. A copy of Vaccine Information Statements will be given before each vaccination. Read this sheet carefully each time. The sheet may change frequently. Talk to your pediatrician regarding the use of this medicine in children. Special care may be needed. Overdosage: If you think you have taken too much of this medicine contact a poison control center or emergency room at once. NOTE: This medicine is only for you. Do not share this medicine with others. What if I miss a dose? This does not apply. What may interact with this medicine?  chemotherapy or radiation therapy  medicines that lower your immune system like etanercept, anakinra, infliximab, and adalimumab  medicines that treat or prevent blood clots like warfarin  phenytoin  steroid medicines like prednisone or cortisone  theophylline  vaccines This list may not describe all possible  interactions. Give your health care provider a list of all the medicines, herbs, non-prescription drugs, or dietary supplements you use. Also tell them if you smoke, drink alcohol, or use illegal drugs. Some items may interact with your medicine. What should I watch for while using this medicine? Report any side effects that do not go away within 3 days to your doctor or health care professional. Call your health care provider if any unusual symptoms occur within 6 weeks of receiving this vaccine. You may still catch the flu, but the illness is not usually as bad. You cannot get the flu from the vaccine. The vaccine will not protect against colds or other illnesses that may cause fever. The vaccine is needed every year. What side effects may I notice from receiving this medicine? Side effects that you should report to your doctor or health care professional as soon as possible:  allergic reactions like skin rash, itching or hives, swelling of the face, lips, or tongue Side effects that usually do not require medical attention (report to your doctor or health care professional if they continue or are bothersome):  fever  headache  muscle aches and pains  pain, tenderness, redness, or swelling at site where injected  weak or tired This list may not describe all possible side effects. Call your doctor for medical advice about side effects. You may report side effects to FDA at 1-800-FDA-1088. Where should I keep my medicine? This vaccine is only given in a clinic, pharmacy, doctor's office, or other health care setting and will not be stored at home. NOTE: This sheet is a summary. It may not cover all possible information. If you have questions   about this medicine, talk to your doctor, pharmacist, or health care provider.  2020 Elsevier/Gold Standard (2007-07-17 09:30:40)   A sleep study will be scheduled  A prescription for Mirapex was given see if you can take this with the methadone  showed your clinic  An EKG was obtained while on methadone  Obtain a hepatitis C HIV complete blood draw levels for primary care and restless leg  A Pap smear will be scheduled with Dr. Chapman Fitch in follow-up  A referral to oncology was made to assess your Hodgkin's disease status  A flu vaccine was given

## 2019-09-29 NOTE — Assessment & Plan Note (Signed)
Opioid use disorder now on methadone 100 mg daily for medication assisted therapy  EKG done today shows normal QTc interval

## 2019-09-29 NOTE — Assessment & Plan Note (Signed)
History of Hodgkin's lymphoma stage IV status post chemotherapy needs reassessment will refer to oncology

## 2019-09-29 NOTE — Assessment & Plan Note (Signed)
Restless leg syndrome will give low-dose Mirapex to take nightly and will obtain sleep study

## 2019-09-29 NOTE — Assessment & Plan Note (Signed)
Under management by psychiatry

## 2019-09-29 NOTE — Assessment & Plan Note (Signed)
Plan sleep study

## 2019-09-29 NOTE — Assessment & Plan Note (Signed)
Essential hypertension poorly controlled we will add losartan HCT 100/12.5 in addition to atenolol and check metabolic profile

## 2019-09-29 NOTE — Progress Notes (Signed)
Patient presents for vaccination against influenza per orders of Dr. Joya Gaskins. Consent given. Counseling provided. No contraindications exists. Vaccine administered without incident.   Benard Halsted, PharmD, [place] [phone]

## 2019-09-29 NOTE — Progress Notes (Signed)
Subjective:    Patient ID: Rachel Wilkerson, female    DOB: 08-18-1985, 34 y.o.   MRN: [id number]  09/29/2019 33 y.o.F with GERD, obesity, anxiety , depression  Here for PCP f/u  This patient has not been seen since 2020 face-to-face last visit was in March and was a telephone visit of this year.  Patient has longstanding history of obesity anxiety depression opioid use disorder now on methadone at a dose of 100 mg daily.  History of Hodgkin's disease stage IV status post chemotherapy in [place] now needing a new oncologist to establish.  Patient needs a Pap smear at this time.  The patient also is here for flu vaccine and is already received her Covid vaccines.  Patient has history of hypertension only on the atenolol.  On arrival blood pressure is 160/101.  Note the patient just moved to the [place] area from [place].  Patient is requesting an EKG to show to her clinic for evaluation of QTc interval.  Patient complains of chronic restless leg and insomnia.   Past Medical History:  Diagnosis Date  . Anxiety   . Back pain   . Cancer ([place])    hodkings lymphoma 2016  . Depression   . GERD (gastroesophageal reflux disease)   . Hypertension      Family History  Problem Relation Age of Onset  . Diabetes Mother   . Schizophrenia Mother   . Bipolar disorder Mother   . Dementia Mother   . Hypertension Father   . Non-Hodgkin's lymphoma Paternal Grandfather   . Heart attack Paternal Grandfather      Social History   Socioeconomic History  . Marital status: Married    Spouse name: Not on file  . Number of children: Not on file  . Years of education: Not on file  . Highest education level: Not on file  Occupational History  . Not on file  Tobacco Use  . Smoking status: Never Smoker  . Smokeless tobacco: Never Used  Substance and Sexual Activity  . Alcohol use: Not Currently  . Drug use: Never  . Sexual activity: Not on file  Other Topics Concern  . Not on file   Social History Narrative  . Not on file   Social Determinants of Health   Financial Resource Strain:   . Difficulty of Paying Living Expenses: Not on file  Food Insecurity:   . Worried About Charity fundraiser in the Last Year: Not on file  . Ran Out of Food in the Last Year: Not on file  Transportation Needs:   . Lack of Transportation (Medical): Not on file  . Lack of Transportation (Non-Medical): Not on file  Physical Activity:   . Days of Exercise per Week: Not on file  . Minutes of Exercise per Session: Not on file  Stress:   . Feeling of Stress : Not on file  Social Connections:   . Frequency of Communication with Friends and Family: Not on file  . Frequency of Social Gatherings with Friends and Family: Not on file  . Attends Religious Services: Not on file  . Active Member of Clubs or Organizations: Not on file  . Attends Archivist Meetings: Not on file  . Marital Status: Not on file  Intimate Partner Violence:   . Fear of Current or Ex-Partner: Not on file  . Emotionally Abused: Not on file  . Physically Abused: Not on file  . Sexually Abused: Not on file  Allergies  Allergen Reactions  . Erythromycin Hives  . Tramadol Hives     Outpatient Medications Prior to Visit  Medication Sig Dispense Refill  . atenolol (TENORMIN) 50 MG tablet TAKE 1 TABLET BY MOUTH EVERY DAY 30 tablet 0  . methadone (DOLOPHINE) 0.4 mg/mL SOLN Take 100 mg by mouth daily.    . naloxone (NARCAN) nasal spray 4 mg/0.1 mL Place 1 spray into the nose as needed.    . prochlorperazine (COMPAZINE) 5 MG tablet TAKE 1 TABLET (5 MG TOTAL) BY MOUTH EVERY 6 (SIX) HOURS AS NEEDED FOR NAUSEA OR VOMITING. 30 tablet 2  . venlafaxine (EFFEXOR) 37.5 MG tablet Take 3 tablets (112.5 mg total) by mouth daily. 90 tablet 1  . cyclobenzaprine (FLEXERIL) 10 MG tablet Take 1 tablet (10 mg total) by mouth 2 (two) times daily as needed for muscle spasms. (Patient not taking: Reported on 09/29/2019) 20  tablet 0  . diclofenac sodium (VOLTAREN) 1 % GEL Apply 2 g topically 4 (four) times daily. (Patient not taking: Reported on 09/29/2019) 150 g 0  . gabapentin (NEURONTIN) 300 MG capsule TAKE 1 CAPSULE BY MOUTH THREE TIMES A DAY (Patient not taking: Reported on 09/29/2019) 90 capsule 1  . ibuprofen (ADVIL) 800 MG tablet Take 1 tablet (800 mg total) by mouth every 8 (eight) hours as needed. (Patient not taking: Reported on 09/29/2019) 21 tablet 0  . omeprazole (PRILOSEC) 40 MG capsule TAKE 1 CAPSULE BY MOUTH TWICE A DAY (Patient not taking: Reported on 09/29/2019) 60 capsule 5  . zolpidem (AMBIEN CR) 12.5 MG CR tablet Take 1 tablet (12.5 mg total) by mouth at bedtime as needed for sleep. (Patient not taking: Reported on 09/29/2019) 30 tablet 1   No facility-administered medications prior to visit.     Review of Systems Constitutional:   No  weight loss, night sweats,  Fevers, chills, fatigue, lassitude. HEENT:   No headaches,  Difficulty swallowing,  Tooth/dental problems,  Sore throat,                No sneezing, itching, ear ache, nasal congestion, post nasal drip,   CV:  No chest pain,  Orthopnea, PND, swelling in lower extremities, anasarca, dizziness, palpitations  GI  No heartburn, indigestion, abdominal pain, nausea, vomiting, diarrhea, change in bowel habits, loss of appetite  Resp: No shortness of breath with exertion or at rest.  No excess mucus, no productive cough,  No non-productive cough,  No coughing up of blood.  No change in color of mucus.  No wheezing.  No chest wall deformity  Skin: no rash or lesions.  GU: no dysuria, change in color of urine, no urgency or frequency.  No flank pain.  MS:   joint pain or swelling.  decreased range of motion.   back pain.  Psych:  No change in mood or affect. No depression or anxiety.  No memory loss.     Objective:   Physical Exam Vitals:   09/29/19 1014  BP: (!) 160/101  Pulse: 80  Resp: 16  Temp: 98.3 F (36.8 C)  SpO2: 97%   Weight: 201 lb 6.4 oz (91.4 kg)  Height: 5\' 4"  (1.626 m)    Gen: Pleasant, well-nourished, in no distress,  normal affect  ENT: No lesions,  mouth clear,  oropharynx clear, no postnasal drip  Neck: No JVD, no TMG, no carotid bruits  Lungs: No use of accessory muscles, no dullness to percussion, clear without rales or rhonchi  Cardiovascular: RRR, heart sounds  normal, no murmur or gallops, no peripheral edema  Abdomen: soft and NT, no HSM,  BS normal  Musculoskeletal: No deformities, no cyanosis or clubbing  Neuro: alert, non focal  Skin: Warm, no lesions or rashes  No results found.        Assessment & Plan:  I personally reviewed all images and lab data in the [facility] system as well as any outside material available during this office visit and agree with the  radiology impressions.   Essential hypertension Essential hypertension poorly controlled we will add losartan HCT 100/12.5 in addition to atenolol and check metabolic profile  Hodgkin lymphoma ([place]) History of Hodgkin's lymphoma stage IV status post chemotherapy needs reassessment will refer to oncology  Opioid use disorder Opioid use disorder now on methadone 100 mg daily for medication assisted therapy  EKG done today shows normal QTc interval  Restless leg Restless leg syndrome will give low-dose Mirapex to take nightly and will obtain sleep study  Primary insomnia Plan sleep study  Depression Under management by psychiatry   Shakeda was seen today for medication refill.  Diagnoses and all orders for this visit:  Methadone use ([place]) -     EKG 12-Lead  Restless leg -     CBC with Differential/Platelet -     Ferritin -     Iron -     Home sleep test; Future  Primary insomnia -     Home sleep test; Future  Hodgkin lymphoma, unspecified Hodgkin lymphoma type, unspecified body region [facility]) -     Ambulatory referral to Oncology  Encounter for screening for HIV -     HIV Antibody (routine  testing w rflx)  Need for hepatitis C screening test -     HCV Ab w/Rflx to Verification  Essential hypertension -     Comprehensive metabolic panel -     CBC with Differential/Platelet  Opioid use disorder  Recurrent major depressive disorder, in partial remission ([place])  Other orders -     pramipexole (MIRAPEX) 0.25 MG tablet; Take 1 tablet (0.25 mg total) by mouth at bedtime. -     losartan-hydrochlorothiazide (HYZAAR) 100-12.5 MG tablet; Take 1 tablet by mouth daily.

## 2019-09-30 ENCOUNTER — Other Ambulatory Visit: Payer: Self-pay | Admitting: Family Medicine

## 2019-09-30 ENCOUNTER — Encounter (HOSPITAL_COMMUNITY): Payer: Self-pay | Admitting: Oral Surgery

## 2019-09-30 ENCOUNTER — Other Ambulatory Visit: Payer: Self-pay

## 2019-09-30 DIAGNOSIS — K219 Gastro-esophageal reflux disease without esophagitis: Secondary | ICD-10-CM

## 2019-09-30 DIAGNOSIS — R110 Nausea: Secondary | ICD-10-CM

## 2019-09-30 LAB — CBC WITH DIFFERENTIAL/PLATELET
Basophils Absolute: 0.1 10*3/uL (ref 0.0–0.2)
Basos: 1 %
EOS (ABSOLUTE): 0.6 10*3/uL — ABNORMAL HIGH (ref 0.0–0.4)
Eos: 5 %
Hematocrit: 35.1 % (ref 34.0–46.6)
Hemoglobin: 12.3 g/dL (ref 11.1–15.9)
Immature Grans (Abs): 0.1 10*3/uL (ref 0.0–0.1)
Immature Granulocytes: 1 %
Lymphocytes Absolute: 2.8 10*3/uL (ref 0.7–3.1)
Lymphs: 23 %
MCH: 28.7 pg (ref 26.6–33.0)
MCHC: 35 g/dL (ref 31.5–35.7)
MCV: 82 fL (ref 79–97)
Monocytes Absolute: 0.7 10*3/uL (ref 0.1–0.9)
Monocytes: 6 %
Neutrophils Absolute: 8 10*3/uL — ABNORMAL HIGH (ref 1.4–7.0)
Neutrophils: 64 %
Platelets: 347 10*3/uL (ref 150–450)
RBC: 4.29 x10E6/uL (ref 3.77–5.28)
RDW: 13.1 % (ref 11.7–15.4)
WBC: 12.3 10*3/uL — ABNORMAL HIGH (ref 3.4–10.8)

## 2019-09-30 LAB — COMPREHENSIVE METABOLIC PANEL
ALT: 16 IU/L (ref 0–32)
AST: 19 IU/L (ref 0–40)
Albumin/Globulin Ratio: 1.1 — ABNORMAL LOW (ref 1.2–2.2)
Albumin: 4.5 g/dL (ref 3.8–4.8)
Alkaline Phosphatase: 97 IU/L (ref 44–121)
BUN/Creatinine Ratio: 12 (ref 9–23)
BUN: 11 mg/dL (ref 6–20)
Bilirubin Total: 0.3 mg/dL (ref 0.0–1.2)
CO2: 23 mmol/L (ref 20–29)
Calcium: 9.7 mg/dL (ref 8.7–10.2)
Chloride: 96 mmol/L (ref 96–106)
Creatinine, Ser: 0.92 mg/dL (ref 0.57–1.00)
GFR calc Af Amer: 93 mL/min/{1.73_m2} (ref >59)
GFR calc non Af Amer: 81 mL/min/{1.73_m2} (ref >59)
Globulin, Total: 4 g/dL (ref 1.5–4.5)
Glucose: 102 mg/dL — ABNORMAL HIGH (ref 65–99)
Potassium: 5.1 mmol/L (ref 3.5–5.2)
Sodium: 138 mmol/L (ref 134–144)
Total Protein: 8.5 g/dL (ref 6.0–8.5)

## 2019-09-30 LAB — HIV ANTIBODY (ROUTINE TESTING W REFLEX): HIV Screen 4th Generation wRfx: NONREACTIVE

## 2019-09-30 LAB — FERRITIN: Ferritin: 583 ng/mL — ABNORMAL HIGH (ref 15–150)

## 2019-09-30 LAB — HCV AB W/RFLX TO VERIFICATION: HCV Ab: <0.1 s/co ratio (ref 0.0–0.9)

## 2019-09-30 LAB — HCV INTERPRETATION

## 2019-09-30 LAB — IRON: Iron: 56 ug/dL (ref 27–159)

## 2019-09-30 NOTE — Telephone Encounter (Signed)
Requested medication (s) are due for refill today:  Yes  Requested medication (s) are on the active medication list:  Yes  Future visit scheduled:  Yes  Last Refill: 07/11/19; #30; RF x 2  Notes to clinic: Medication not delegated.  Requested Prescriptions  Pending Prescriptions Disp Refills   prochlorperazine (COMPAZINE) 5 MG tablet [Pharmacy Med Name: PROCHLORPERAZINE 5 MG TABLET] 30 tablet 2    Sig: TAKE 1 TABLET (5 MG TOTAL) BY MOUTH EVERY 6 (SIX) HOURS AS NEEDED FOR NAUSEA OR VOMITING.      Not Delegated - Gastroenterology: Antiemetics Failed - 09/30/2019  3:13 PM      Failed - This refill cannot be delegated      Passed - Valid encounter within last 6 months    Recent Outpatient Visits           Yesterday Need for influenza vaccination   [place], RPH-CPP   Yesterday Methadone use [facility])   [place] Elsie Stain, MD   6 months ago Chronic left lumbar radiculopathy   [place], MD   7 months ago Essential hypertension   [place], MD       Future Appointments             In 1 month Antony Blackbird, MD [place]

## 2019-09-30 NOTE — H&P (Signed)
HISTORY AND PHYSICAL  Rachel Wilkerson is a 34 y.o. female patient referred by general dentist for multiple extractions. Began having moderate edema left mandible yesterday.   No diagnosis found.  Past Medical History:  Diagnosis Date  . Anxiety   . Back pain   . Cancer ([place])    hodkings lymphoma 2016  . Depression   . GERD (gastroesophageal reflux disease)   . Hypertension     No current facility-administered medications for this encounter.   Current Outpatient Medications  Medication Sig Dispense Refill  . atenolol (TENORMIN) 50 MG tablet TAKE 1 TABLET BY MOUTH EVERY DAY (Patient taking differently: Take 50 mg by mouth daily. ) 30 tablet 0  . losartan-hydrochlorothiazide (HYZAAR) 100-12.5 MG tablet Take 1 tablet by mouth daily. 90 tablet 3  . magnesium oxide (MAG-OX) 400 MG tablet Take 400 mg by mouth daily.    . Melatonin 10 MG TABS Take 10 mg by mouth at bedtime.    . naloxone (NARCAN) nasal spray 4 mg/0.1 mL Place 1 spray into the nose as needed (Over dose).     Marland Kitchen omeprazole (PRILOSEC) 40 MG capsule Take 40 mg by mouth in the morning and at bedtime.    . pramipexole (MIRAPEX) 0.25 MG tablet Take 1 tablet (0.25 mg total) by mouth at bedtime. 30 tablet 2  . prochlorperazine (COMPAZINE) 5 MG tablet TAKE 1 TABLET (5 MG TOTAL) BY MOUTH EVERY 6 (SIX) HOURS AS NEEDED FOR NAUSEA OR VOMITING. 30 tablet 2  . Venlafaxine HCl 225 MG TB24 Take 225 mg by mouth daily.    . methadone (DOLOPHINE) 0.4 mg/mL SOLN Take 100 mg by mouth daily.    Marland Kitchen venlafaxine (EFFEXOR) 37.5 MG tablet Take 3 tablets (112.5 mg total) by mouth daily. (Patient not taking: Reported on 09/30/2019) 90 tablet 1   Allergies  Allergen Reactions  . Erythromycin Hives  . Tramadol Hives   Active Problems:   * No active hospital problems. *  Vitals: There were no vitals taken for this visit. Lab results:No results found for this or any previous visit (from the past 9 hour(s)). Radiology Results: No results  found. General appearance: alert, cooperative, no distress and moderately obese Head: Normocephalic, without obvious abnormality, atraumatic Eyes: negative Nose: Nares normal. Septum midline. Mucosa normal. No drainage or sinus tenderness. Throat: Multiple carious teeth # 6, 7, 8, 15, 18, 19, 30, 31. No fluctuance, purulence, trismus. Pharynx clear. Neck: no adenopathy, supple, symmetrical, trachea midline and Moderate edema left subvmandibular area. Resp: clear to auscultation bilaterally Cardio: regular rate and rhythm, S1, S2 normal, no murmur, click, rub or gallop  Assessment:Multiple non-restorable teeth secondary to dental caries. Left submandibular infection.  Plan:Dental extractions, possible incision and drainage left submandibular . [place], Day surgery   Diona Browner 09/30/2019

## 2019-09-30 NOTE — Progress Notes (Signed)
MS. Dittmer deneies chest pain or shortness of breath.Patient denies any s/s of COvid and has not been in contact with anyone with s/s or Covid

## 2019-10-01 ENCOUNTER — Encounter (HOSPITAL_COMMUNITY)
Admission: RE | Disposition: A | Discharge status: Home / Self Care | Payer: Self-pay | Source: Ambulatory Visit | Attending: Oral Surgery

## 2019-10-01 ENCOUNTER — Ambulatory Visit (HOSPITAL_COMMUNITY): Payer: Medicaid Other | Admitting: Registered Nurse

## 2019-10-01 ENCOUNTER — Ambulatory Visit (HOSPITAL_COMMUNITY)
Admission: RE | Admit: 2019-10-01 | Discharge: 2019-10-01 | Disposition: A | Discharge status: Home / Self Care | Payer: Medicaid Other | Source: Ambulatory Visit | Attending: Oral Surgery | Admitting: Oral Surgery

## 2019-10-01 ENCOUNTER — Other Ambulatory Visit: Payer: Self-pay

## 2019-10-01 ENCOUNTER — Encounter (HOSPITAL_COMMUNITY): Payer: Self-pay | Admitting: Oral Surgery

## 2019-10-01 DIAGNOSIS — Z79899 Other long term (current) drug therapy: Secondary | ICD-10-CM | POA: Insufficient documentation

## 2019-10-01 DIAGNOSIS — Z881 Allergy status to other antibiotic agents status: Secondary | ICD-10-CM | POA: Insufficient documentation

## 2019-10-01 DIAGNOSIS — M1990 Unspecified osteoarthritis, unspecified site: Secondary | ICD-10-CM | POA: Insufficient documentation

## 2019-10-01 DIAGNOSIS — Z888 Allergy status to other drugs, medicaments and biological substances status: Secondary | ICD-10-CM | POA: Insufficient documentation

## 2019-10-01 DIAGNOSIS — Z8571 Personal history of Hodgkin lymphoma: Secondary | ICD-10-CM | POA: Diagnosis not present

## 2019-10-01 DIAGNOSIS — I10 Essential (primary) hypertension: Secondary | ICD-10-CM | POA: Insufficient documentation

## 2019-10-01 DIAGNOSIS — Z885 Allergy status to narcotic agent status: Secondary | ICD-10-CM | POA: Insufficient documentation

## 2019-10-01 DIAGNOSIS — K219 Gastro-esophageal reflux disease without esophagitis: Secondary | ICD-10-CM | POA: Diagnosis not present

## 2019-10-01 DIAGNOSIS — K029 Dental caries, unspecified: Secondary | ICD-10-CM | POA: Diagnosis present

## 2019-10-01 DIAGNOSIS — Z20822 Contact with and (suspected) exposure to covid-19: Secondary | ICD-10-CM | POA: Insufficient documentation

## 2019-10-01 DIAGNOSIS — E669 Obesity, unspecified: Secondary | ICD-10-CM | POA: Diagnosis not present

## 2019-10-01 DIAGNOSIS — F1120 Opioid dependence, uncomplicated: Secondary | ICD-10-CM | POA: Insufficient documentation

## 2019-10-01 HISTORY — DX: Age-related osteoporosis without current pathological fracture: M81.0

## 2019-10-01 HISTORY — DX: Unspecified osteoarthritis, unspecified site: M19.90

## 2019-10-01 HISTORY — PX: INCISION AND DRAINAGE ABSCESS: SHX5864

## 2019-10-01 HISTORY — DX: Restless legs syndrome: G25.81

## 2019-10-01 HISTORY — DX: Other complications of anesthesia, initial encounter: T88.59XA

## 2019-10-01 HISTORY — DX: Family history of other specified conditions: Z84.89

## 2019-10-01 HISTORY — PX: TOOTH EXTRACTION: SHX859

## 2019-10-01 LAB — POCT PREGNANCY, URINE: Preg Test, Ur: NEGATIVE

## 2019-10-01 LAB — SARS CORONAVIRUS 2 BY RT PCR (HOSPITAL ORDER, PERFORMED IN ~~LOC~~ HOSPITAL LAB): SARS Coronavirus 2: NEGATIVE

## 2019-10-01 SURGERY — DENTAL RESTORATION/EXTRACTIONS
Anesthesia: General | Site: Mouth

## 2019-10-01 MED ORDER — CEFAZOLIN SODIUM-DEXTROSE 2-4 GM/100ML-% IV SOLN
2.0000 g | INTRAVENOUS | Status: AC
  Administered 2019-10-01: 2 g via INTRAVENOUS

## 2019-10-01 MED ORDER — PROPOFOL 10 MG/ML IV BOLUS
INTRAVENOUS | Status: DC | PRN
  Administered 2019-10-01: 200 mg via INTRAVENOUS

## 2019-10-01 MED ORDER — MIDAZOLAM HCL 2 MG/2ML IJ SOLN
INTRAMUSCULAR | Status: AC
  Filled 2019-10-01: qty 2

## 2019-10-01 MED ORDER — IBUPROFEN 800 MG PO TABS: 800.0000 mg | ORAL_TABLET | Freq: Three times a day (TID) | ORAL | 0 refills | Status: AC | PRN

## 2019-10-01 MED ORDER — OXYCODONE HCL 5 MG PO TABS: 5.0000 mg | ORAL_TABLET | ORAL | 0 refills | Status: AC | PRN

## 2019-10-01 MED ORDER — DIPHENHYDRAMINE HCL 50 MG/ML IJ SOLN
INTRAMUSCULAR | Status: DC | PRN
  Administered 2019-10-01: 12.5 mg via INTRAVENOUS

## 2019-10-01 MED ORDER — AMOXICILLIN-POT CLAVULANATE 875-125 MG PO TABS: 1.0000 | ORAL_TABLET | Freq: Two times a day (BID) | ORAL | 0 refills | Status: AC

## 2019-10-01 MED ORDER — OXYMETAZOLINE HCL 0.05 % NA SOLN
NASAL | Status: AC
  Filled 2019-10-01: qty 30

## 2019-10-01 MED ORDER — LIDOCAINE-EPINEPHRINE 2 %-1:100000 IJ SOLN
INTRAMUSCULAR | Status: DC | PRN
  Administered 2019-10-01: 20 mL via INTRADERMAL

## 2019-10-01 MED ORDER — ORAL CARE MOUTH RINSE: 15.0000 mL | Freq: Once | OROMUCOSAL | Status: AC

## 2019-10-01 MED ORDER — MIDAZOLAM HCL 2 MG/2ML IJ SOLN
INTRAMUSCULAR | Status: AC
  Administered 2019-10-01: 2 mg via INTRAVENOUS
  Filled 2019-10-01: qty 2

## 2019-10-01 MED ORDER — DEXMEDETOMIDINE (PRECEDEX) IN NS 20 MCG/5ML (4 MCG/ML) IV SYRINGE
PREFILLED_SYRINGE | INTRAVENOUS | Status: DC | PRN
  Administered 2019-10-01: 8 ug via INTRAVENOUS
  Administered 2019-10-01: 12 ug via INTRAVENOUS

## 2019-10-01 MED ORDER — CHLORHEXIDINE GLUCONATE 0.12 % MT SOLN
OROMUCOSAL | Status: AC
  Administered 2019-10-01: 15 mL via OROMUCOSAL
  Filled 2019-10-01: qty 15

## 2019-10-01 MED ORDER — DEXAMETHASONE SODIUM PHOSPHATE 10 MG/ML IJ SOLN
INTRAMUSCULAR | Status: DC | PRN
  Administered 2019-10-01: 10 mg via INTRAVENOUS

## 2019-10-01 MED ORDER — ONDANSETRON HCL 4 MG/2ML IJ SOLN
INTRAMUSCULAR | Status: DC | PRN
  Administered 2019-10-01: 4 mg via INTRAVENOUS

## 2019-10-01 MED ORDER — SUGAMMADEX SODIUM 200 MG/2ML IV SOLN
INTRAVENOUS | Status: DC | PRN
  Administered 2019-10-01: 100 mg via INTRAVENOUS

## 2019-10-01 MED ORDER — LIDOCAINE 2% (20 MG/ML) 5 ML SYRINGE
INTRAMUSCULAR | Status: DC | PRN
  Administered 2019-10-01: 60 mg via INTRAVENOUS

## 2019-10-01 MED ORDER — FENTANYL CITRATE (PF) 250 MCG/5ML IJ SOLN
INTRAMUSCULAR | Status: AC
  Filled 2019-10-01: qty 5

## 2019-10-01 MED ORDER — ROCURONIUM BROMIDE 10 MG/ML (PF) SYRINGE
PREFILLED_SYRINGE | INTRAVENOUS | Status: DC | PRN
  Administered 2019-10-01: 10 mg via INTRAVENOUS

## 2019-10-01 MED ORDER — 0.9 % SODIUM CHLORIDE (POUR BTL) OPTIME
TOPICAL | Status: DC | PRN
  Administered 2019-10-01: 1000 mL

## 2019-10-01 MED ORDER — CHLORHEXIDINE GLUCONATE 0.12 % MT SOLN: 15.0000 mL | Freq: Once | OROMUCOSAL | Status: AC

## 2019-10-01 MED ORDER — FENTANYL CITRATE (PF) 250 MCG/5ML IJ SOLN
INTRAMUSCULAR | Status: DC | PRN
Start: 2019-10-01 — End: 2019-10-01
  Administered 2019-10-01: 250 ug via INTRAVENOUS

## 2019-10-01 MED ORDER — ONDANSETRON HCL 4 MG/2ML IJ SOLN: 4.0000 mg | Freq: Once | INTRAMUSCULAR | Status: DC | PRN

## 2019-10-01 MED ORDER — CEFAZOLIN SODIUM-DEXTROSE 2-4 GM/100ML-% IV SOLN
INTRAVENOUS | Status: AC
  Filled 2019-10-01: qty 100

## 2019-10-01 MED ORDER — SUCCINYLCHOLINE CHLORIDE 200 MG/10ML IV SOSY
PREFILLED_SYRINGE | INTRAVENOUS | Status: DC | PRN
  Administered 2019-10-01: 120 mg via INTRAVENOUS

## 2019-10-01 MED ORDER — SODIUM CHLORIDE 0.9 % IR SOLN
Status: DC | PRN
  Administered 2019-10-01: 1000 mL

## 2019-10-01 MED ORDER — LACTATED RINGERS IV SOLN: INTRAVENOUS | Status: DC

## 2019-10-01 MED ORDER — FENTANYL CITRATE (PF) 100 MCG/2ML IJ SOLN: 25.0000 ug | INTRAMUSCULAR | Status: DC | PRN

## 2019-10-01 MED ORDER — OXYMETAZOLINE HCL 0.05 % NA SOLN
NASAL | Status: DC | PRN
  Administered 2019-10-01 (×2): 1 via NASAL

## 2019-10-01 MED ORDER — LIDOCAINE-EPINEPHRINE 2 %-1:100000 IJ SOLN
INTRAMUSCULAR | Status: AC
  Filled 2019-10-01: qty 1.7

## 2019-10-01 MED ORDER — MIDAZOLAM HCL 2 MG/2ML IJ SOLN: 2.0000 mg | Freq: Once | INTRAMUSCULAR | Status: AC

## 2019-10-01 MED ORDER — PROPOFOL 10 MG/ML IV BOLUS
INTRAVENOUS | Status: AC
  Filled 2019-10-01: qty 20

## 2019-10-01 MED ORDER — LIDOCAINE-EPINEPHRINE 2 %-1:100000 IJ SOLN
INTRAMUSCULAR | Status: AC
  Filled 2019-10-01: qty 1

## 2019-10-01 SURGICAL SUPPLY — 37 items
BLADE SURG 15 STRL LF DISP TIS (BLADE) ×2 IMPLANT
BLADE SURG 15 STRL SS (BLADE) ×1
BUR CROSS CUT FISSURE 1.6 (BURR) ×3 IMPLANT
BUR EGG ELITE 4.0 (BURR) ×3 IMPLANT
CANISTER SUCT 3000ML PPV (MISCELLANEOUS) ×3 IMPLANT
COVER SURGICAL LIGHT HANDLE (MISCELLANEOUS) ×3 IMPLANT
COVER WAND RF STERILE (DRAPES) IMPLANT
DECANTER SPIKE VIAL GLASS SM (MISCELLANEOUS) ×3 IMPLANT
DRAPE U-SHAPE 76X120 STRL (DRAPES) ×3 IMPLANT
GAUZE PACKING FOLDED 2  STR (GAUZE/BANDAGES/DRESSINGS) ×1
GAUZE PACKING FOLDED 2 STR (GAUZE/BANDAGES/DRESSINGS) ×2 IMPLANT
GLOVE BIO SURGEON STRL SZ 6.5 (GLOVE) IMPLANT
GLOVE BIO SURGEON STRL SZ7 (GLOVE) IMPLANT
GLOVE BIO SURGEON STRL SZ8 (GLOVE) ×3 IMPLANT
GLOVE BIOGEL PI IND STRL 6.5 (GLOVE) IMPLANT
GLOVE BIOGEL PI IND STRL 7.0 (GLOVE) IMPLANT
GLOVE BIOGEL PI INDICATOR 6.5 (GLOVE)
GLOVE BIOGEL PI INDICATOR 7.0 (GLOVE)
GOWN STRL REUS W/ TWL LRG LVL3 (GOWN DISPOSABLE) ×2 IMPLANT
GOWN STRL REUS W/ TWL XL LVL3 (GOWN DISPOSABLE) ×2 IMPLANT
GOWN STRL REUS W/TWL LRG LVL3 (GOWN DISPOSABLE) ×1
GOWN STRL REUS W/TWL XL LVL3 (GOWN DISPOSABLE) ×1
IV NS 1000ML (IV SOLUTION) ×1
IV NS 1000ML BAXH (IV SOLUTION) ×2 IMPLANT
KIT BASIN OR (CUSTOM PROCEDURE TRAY) ×3 IMPLANT
KIT TURNOVER KIT B (KITS) ×3 IMPLANT
NDL HYPO 25GX1X1/2 BEV (NEEDLE) ×4 IMPLANT
NEEDLE HYPO 25GX1X1/2 BEV (NEEDLE) ×6 IMPLANT
NS IRRIG 1000ML POUR BTL (IV SOLUTION) ×3 IMPLANT
PAD ARMBOARD 7.5X6 YLW CONV (MISCELLANEOUS) ×3 IMPLANT
SLEEVE IRRIGATION ELITE 7 (MISCELLANEOUS) ×3 IMPLANT
SPONGE SURGIFOAM ABS GEL 12-7 (HEMOSTASIS) IMPLANT
SUT CHROMIC 3 0 PS 2 (SUTURE) ×3 IMPLANT
SYR CONTROL 10ML LL (SYRINGE) ×3 IMPLANT
TRAY ENT MC OR (CUSTOM PROCEDURE TRAY) ×3 IMPLANT
TUBING IRRIGATION (MISCELLANEOUS) ×3 IMPLANT
YANKAUER SUCT BULB TIP NO VENT (SUCTIONS) ×3 IMPLANT

## 2019-10-01 NOTE — Anesthesia Postprocedure Evaluation (Signed)
Anesthesia Post Note  Patient: Rachel Wilkerson  Procedure(s) Performed: DENTAL  EXTRACTIONS TEETH NUMBER SIX, SEVEN, EIGHT, FIFTEEN, EIGHTEEN, NINETEEN, THIRTY, THIRTY-ONE (N/A Mouth) INCISION AND DRAINAGE LEFT MANDIBLE ABSCESS (Mouth)     Patient location during evaluation: PACU Anesthesia Type: General Level of consciousness: awake and alert and oriented Pain management: pain level controlled Vital Signs Assessment: post-procedure vital signs reviewed and stable Respiratory status: spontaneous breathing, nonlabored ventilation and respiratory function stable Cardiovascular status: blood pressure returned to baseline and stable Postop Assessment: no apparent nausea or vomiting Anesthetic complications: no   No complications documented.  Last Vitals:  Vitals:   10/01/19 1547 10/01/19 1556  BP: 135/68 136/64  Pulse: (!) 107 100  Resp: 16 17  Temp:  36.7 C  SpO2: 96% 100%    Last Pain:  Vitals:   10/01/19 1556  TempSrc:   PainSc: 0-No pain                 Zaelyn Barbary A.

## 2019-10-01 NOTE — Anesthesia Preprocedure Evaluation (Addendum)
Anesthesia Evaluation  Patient identified by MRN, date of birth, ID band Patient awake    Reviewed: Allergy & Precautions, NPO status , Patient's Chart, lab work & pertinent test results, reviewed documented beta blocker date and time   History of Anesthesia Complications (+) MALIGNANT HYPERTHERMIA, PROLONGED EMERGENCE and history of anesthetic complications  Airway Mallampati: IV  TM Distance: >3 FB Neck ROM: Full  Mouth opening: Limited Mouth Opening  Dental  (+) Poor Dentition, Loose, Missing, Dental Advisory Given, Chipped   Pulmonary    Pulmonary exam normal breath sounds clear to auscultation       Cardiovascular hypertension, Pt. on medications and Pt. on home beta blockers Normal cardiovascular exam Rhythm:Regular Rate:Normal     Neuro/Psych PSYCHIATRIC DISORDERS Anxiety Depression negative neurological ROS     GI/Hepatic GERD  Medicated and Controlled,(+)     substance abuse  , On methadone maintenance Non restorable teeth Left mandibular intraoral abscess   Endo/Other  Obesity  Renal/GU negative Renal ROS  negative genitourinary   Musculoskeletal  (+) Arthritis , Osteoarthritis,  narcotic dependent  Abdominal (+) + obese,   Peds  Hematology Hx/o Hodgkin's lymphoma 2016   Anesthesia Other Findings   Reproductive/Obstetrics                           Anesthesia Physical Anesthesia Plan  ASA: II  Anesthesia Plan: General   Post-op Pain Management:    Induction: Intravenous  PONV Risk Score and Plan: 4 or greater and Midazolam, Ondansetron and Treatment may vary due to age or medical condition  Airway Management Planned: Nasal ETT and Video Laryngoscope Planned  Additional Equipment:   Intra-op Plan:   Post-operative Plan: Extubation in OR  Informed Consent: I have reviewed the patients History and Physical, chart, labs and discussed the procedure including the risks,  benefits and alternatives for the proposed anesthesia with the patient or authorized representative who has indicated his/her understanding and acceptance.     Dental advisory given  Plan Discussed with: CRNA and Anesthesiologist  Anesthesia Plan Comments: (Afrin spray both nostrils. Will place NTT in right nares )       Anesthesia Quick Evaluation

## 2019-10-01 NOTE — Anesthesia Procedure Notes (Addendum)
Procedure Name: Intubation Date/Time: 10/01/2019 2:35 PM Performed by: Jenne Campus, CRNA Pre-anesthesia Checklist: Patient identified, Emergency Drugs available, Suction available and Patient being monitored Patient Re-evaluated:Patient Re-evaluated prior to induction Oxygen Delivery Method: Circle System Utilized Preoxygenation: Pre-oxygenation with 100% oxygen Induction Type: IV induction Ventilation: Mask ventilation without difficulty Laryngoscope Size: Glidescope and 3 Grade View: Grade I Tube type: Oral Nasal Tubes: Right, Magill forceps- large, utilized, Nasal prep performed and Nasal Rae Tube size: 6.5 mm Number of attempts: 1 Airway Equipment and Method: Stylet Placement Confirmation: ETT inserted through vocal cords under direct vision,  positive ETCO2 and breath sounds checked- equal and bilateral Secured at: 26 cm Tube secured with: Tape Dental Injury: Teeth and Oropharynx as per pre-operative assessment  Comments: Smooth IV induction. EZ mask. Nasal intubation with  6.5 Nasal Rae ETT placed through right nare. +ETCO2 BBSE. Cuff on Nasal Rae ETT not holding air. Bougie placed through existing ETT and exchanged with new 6.5 Nasal Rae @ 26 at right nare. +ETCO2 BBSE.

## 2019-10-01 NOTE — Op Note (Signed)
10/01/2019  3:03 PM  PATIENT:  Rachel Wilkerson  34 y.o. female  PRE-OPERATIVE DIAGNOSIS:  NON RESTORABLE TEETH NUMBER SIX, SEVEN, EIGHT, FIFTEEN, EIGHTEEN, NINETEEN, THIRTY, THIRTY-ONE; LEFT SUBMANDIBULAR SPACE INFECTION   POST-OPERATIVE DIAGNOSIS:  SAME  PROCEDURE:  Procedure(s): DENTAL  EXTRACTIONS TEETH NUMBER SIX, SEVEN, EIGHT, FIFTEEN, EIGHTEEN, NINETEEN, THIRTY, THIRTY-ONE, INCISION AND DRAINAGE LEFT SUBMANDIBULAR SPACE INFECTION  SURGEON:  Surgeon(s): Diona Browner, DDS  ANESTHESIA:   local and general  EBL:  minimal  DRAINS: none   SPECIMEN:  No Specimen  COUNTS:  YES  PLAN OF CARE: Discharge to home after PACU  PATIENT DISPOSITION:  PACU - hemodynamically stable.   PROCEDURE DETAILS: Dictation # [id number]  Gae Bon, DMD 10/01/2019 3:03 PM

## 2019-10-01 NOTE — Transfer of Care (Signed)
Immediate Anesthesia Transfer of Care Note  Patient: Rachel Wilkerson  Procedure(s) Performed: DENTAL  EXTRACTIONS TEETH NUMBER SIX, SEVEN, EIGHT, FIFTEEN, EIGHTEEN, NINETEEN, THIRTY, THIRTY-ONE (N/A Mouth) INCISION AND DRAINAGE LEFT MANDIBLE ABSCESS (Mouth)  Patient Location: PACU  Anesthesia Type:General  Level of Consciousness: oriented, sedated and patient cooperative  Airway & Oxygen Therapy: Patient Spontanous Breathing and Patient connected to face mask oxygen  Post-op Assessment: Report given to RN and Post -op Vital signs reviewed and stable  Post vital signs: Reviewed  Last Vitals:  Vitals Value Taken Time  BP 121/69 10/01/19 1517  Temp    Pulse 107 10/01/19 1525  Resp 20 10/01/19 1525  SpO2 99 % 10/01/19 1525  Vitals shown include unvalidated device data.  Last Pain:  Vitals:   10/01/19 1301  TempSrc:   PainSc: 10-Worst pain ever      Patients Stated Pain Goal: 2 (13/68/59 9234)  Complications: No complications documented.

## 2019-10-01 NOTE — H&P (Signed)
H&P documentation  -History and Physical Reviewed  -Patient has been re-examined  -No change in the plan of care  Rachel Wilkerson  

## 2019-10-01 NOTE — Op Note (Signed)
NAME: Rachel Wilkerson, Rachel Wilkerson WG:[id number] ACCOUNT [id number] DATE OF BIRTH:April 27, 1985 FACILITY: [facility] LOCATION: [facility] PHYSICIAN:Teran Knittle M. Beecher Furio, DDS  OPERATIVE REPORT  DATE OF PROCEDURE:  10/01/2019  PREOPERATIVE DIAGNOSIS:  Left submandibular space infection, nonrestorable teeth #6, 7, 8, 15, 18, 19, 30, 31.  POSTOPERATIVE DIAGNOSIS:  Left submandibular space infection, nonrestorable teeth #6, 7, 8, 15, 18, 19, 30, 31.  PROCEDURE:   1.  Extraction of teeth numbers 6, 7, 8, 15, 18, 19, 30, 31. 2.  Incision and drainage, left submandibular space infection.  SURGEON:  Diona Browner, DDS  ANESTHESIA:  General nasal intubation, Dr. Royce Macadamia attending.  DESCRIPTION OF PROCEDURE:  The patient was taken to the operating room and placed on the table in supine position.  General anesthesia was administered and a nasal endotracheal tube was placed.  The cuff of the tube was torn during intubation and the  tube was replaced using a stylet and then once the tube was secured, the patient was draped.  A timeout was performed.  The posterior pharynx was suctioned and a throat pack was placed.  Two percent lidocaine with 1:100,000 epinephrine was infiltrated in  an inferior alveolar block on the right and left sides and buccal and palatal infiltration in the maxilla around the teeth to be removed.  A bite block was placed on the right side of the mouth and a sweetheart retractor was used to retract the tongue.   The left side was operated first.  In the mandibular region, there was an edematous area of tissue lateral to teeth numbers 18 and 19.  This was incised and purulent material was expressed.  Then, the incision was made in the gingival sulcus around  teeth numbers 18 and 19.  The teeth were elevated.  Tooth #18 was removed with the dental forceps.  Tooth #19 required removal of bone with a Stryker handpiece under irrigation and then the tooth was removed with the dental forceps.  Then,  the socket was  curetted, irrigated.  Then, the hemostat was used to go down into the submandibular tissues on the buccal aspect of the mandible.  No additional purulent material was expressed.  The area was irrigated and closed with 3-0 chromic.  Then, a 15 blade was  used to make an incision around tooth #15.  The periosteum was reflected.  The tooth was elevated with a 301 elevator and then removed with the dental forceps.  The socket was irrigated.  No closure was needed.  The bite block and sweetheart retractor  were repositioned to the other side of the mouth and a 15 blade was used to make an incision around teeth numbers 30 and 31 in the mandible and 6, 7 and 8 in the maxilla.  The periosteum was reflected from around these teeth.  The teeth were elevated  with a 301 elevator.  Teeth numbers 6, 7 and 8 were removed in routine fashion with the dental forceps.  Then, tooth #31 was removed with the elevator and the dental forceps.  Teeth #30 required additional bone removal as a portion of the mesial root  broke off upon removal with the forceps.  The root tip was removed with a root tip elevator and then the sockets on the right side were curetted, irrigated and closed with 3-0 chromic.  Additional anesthesia was administered in all the extraction areas  and then the patient was irrigated, suctioned and the throat pack was removed.  The patient was left in care of  anesthesia for transport to recovery room with plans for discharge home through day surgery.  ESTIMATED BLOOD LOSS:  Minimal.  COMPLICATIONS:  None.  SPECIMENS:  None.  VN/NUANCE  D:10/01/2019 T:10/01/2019 JOB:[id number]

## 2019-10-02 ENCOUNTER — Encounter (HOSPITAL_COMMUNITY): Payer: Self-pay | Admitting: Oral Surgery

## 2019-10-16 ENCOUNTER — Other Ambulatory Visit: Payer: Self-pay | Admitting: Family Medicine

## 2019-10-16 DIAGNOSIS — I10 Essential (primary) hypertension: Secondary | ICD-10-CM

## 2019-10-19 IMAGING — CR PORTABLE CHEST - 1 VIEW
1 series · 1 of 1 positions shown · non-contrast
Comparison: None.

CLINICAL DATA: Cough, chest pain, shortness of breath.

EXAM:
PORTABLE CHEST 1 VIEW

[AP]
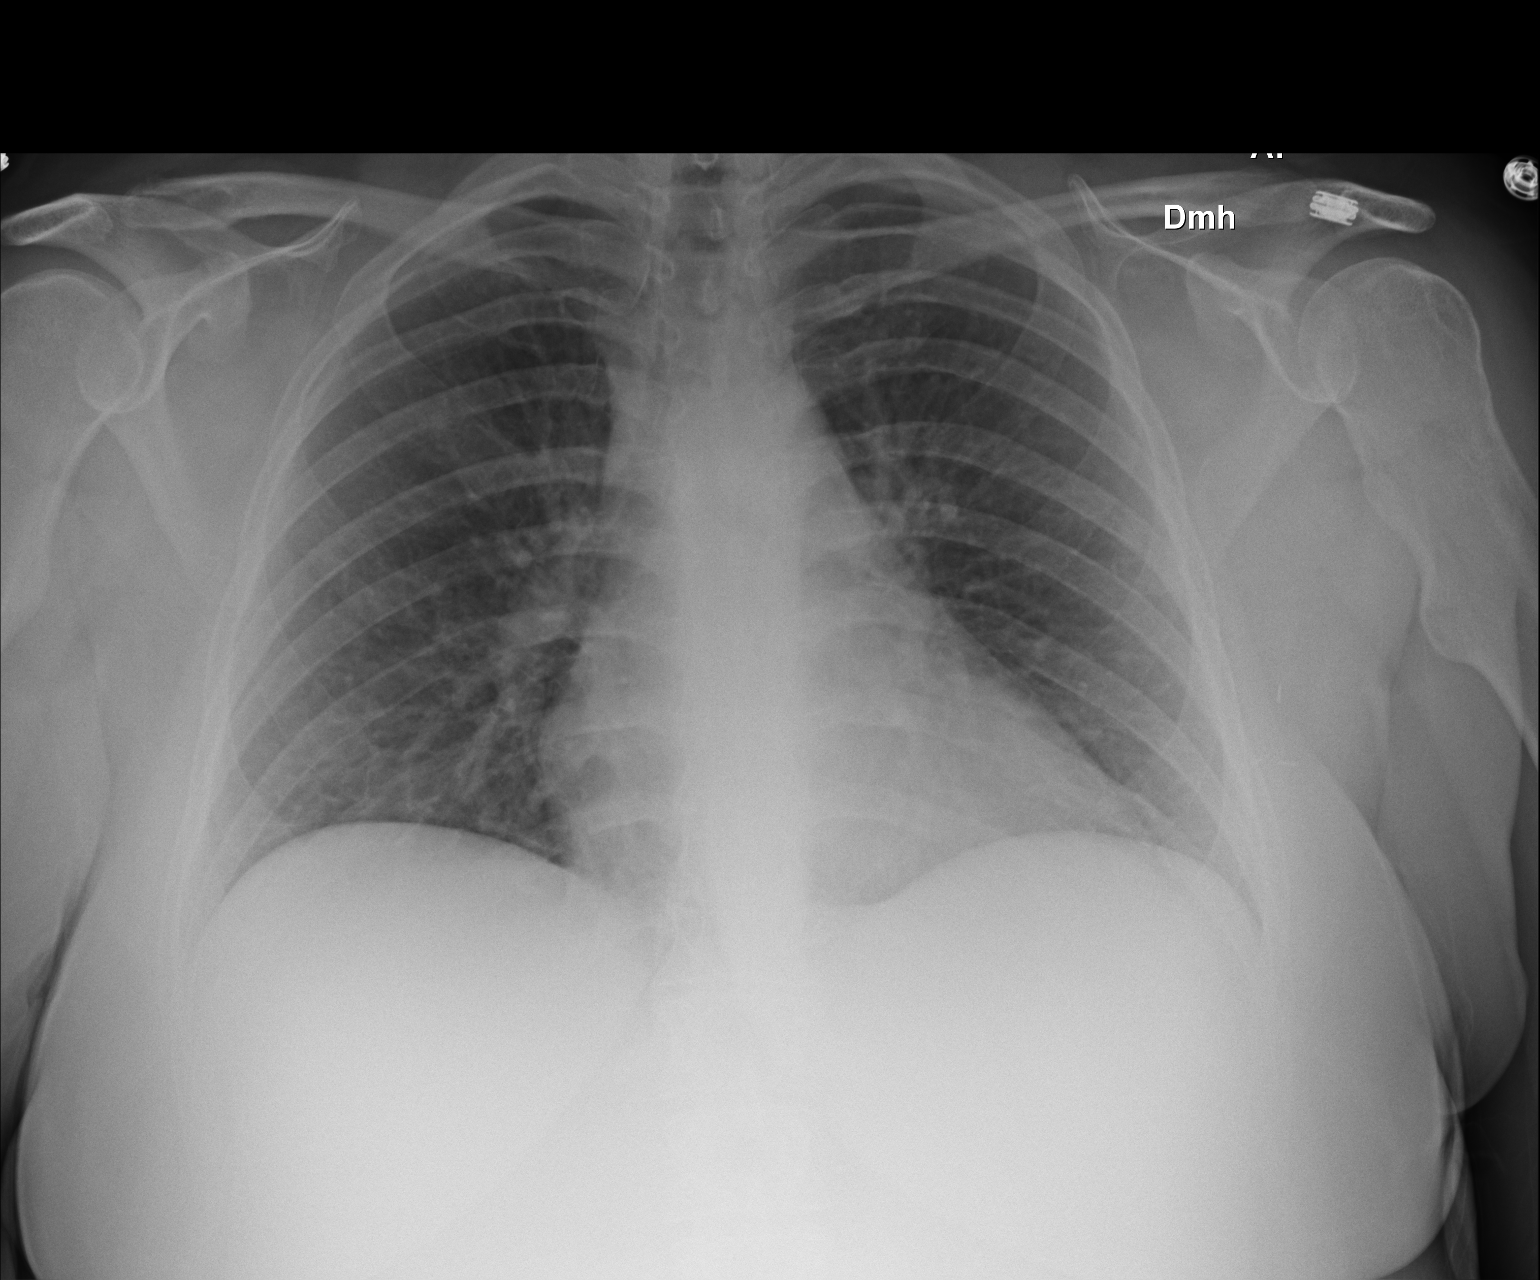

[1 of 1 positions shown; findings below may reference images not displayed]

FINDINGS: The heart size and mediastinal contours are within normal limits.
Normal pulmonary vascularity. No focal consolidation, pleural
effusion, or pneumothorax. No acute osseous abnormality. Sessile
osteochondromas of the left and probably right proximal humeri.
IMPRESSION: 1. No active disease.
2. Sessile osteochondromas of the left and probably right proximal
humeri, suggestive of multiple hereditary exostoses.

## 2019-10-30 IMAGING — CT CT HEAD WITHOUT CONTRAST
3 series · 15 of 47 positions shown, 18 images · non-contrast
Comparison: None.

CLINICAL DATA: Patient with confusion and hallucinations.

EXAM:
CT HEAD WITHOUT CONTRAST
TECHNIQUE: Contiguous axial images were obtained from the base of the skull
through the vertex without intravenous contrast.

[Series 3: head 5.0 h30s · axial · 0.41mm/px · z∈[-90,+35]mm · 9 of 30 slices shown, 12 images]
[im 3/30  brain]
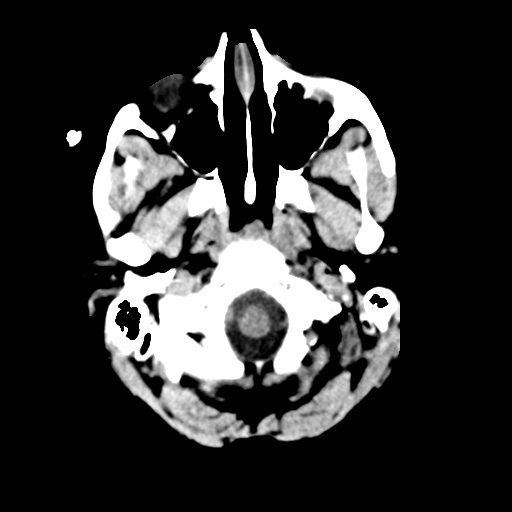
[im 3/30  bone]
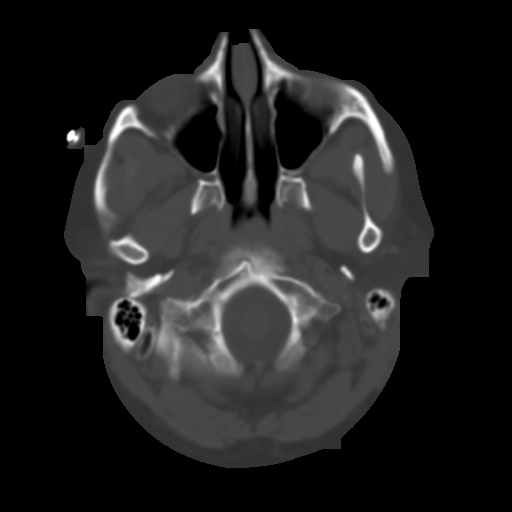
[im 6/30  brain]
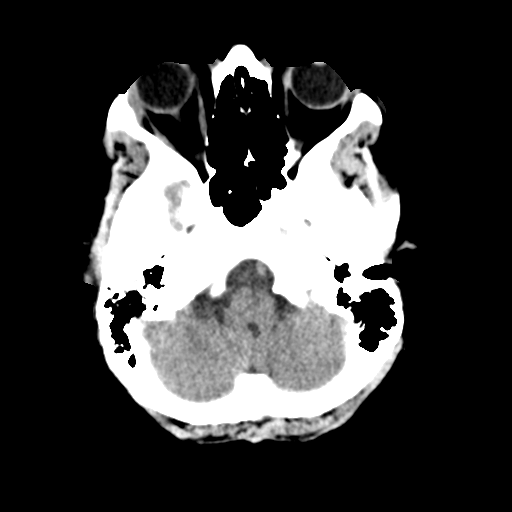
[im 9/30  brain]
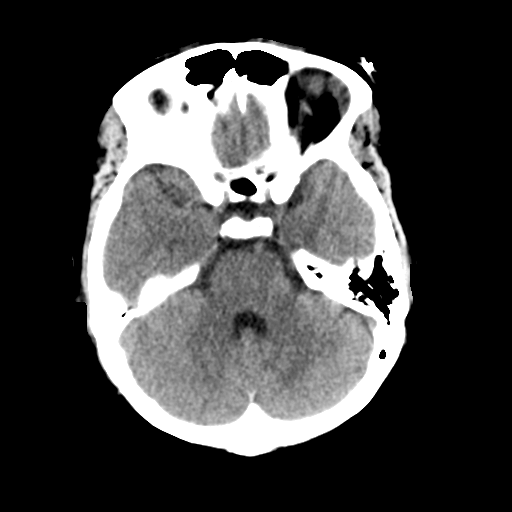
[im 12/30  brain]
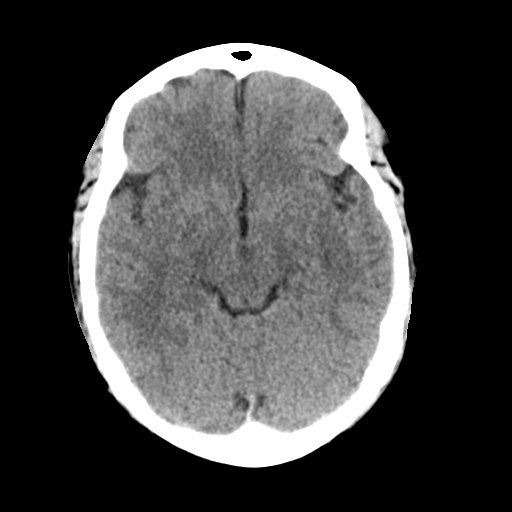
[im 16/30  brain]
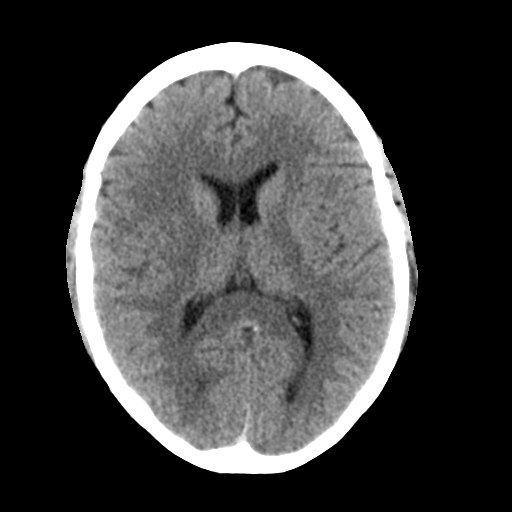
[im 16/30  bone]
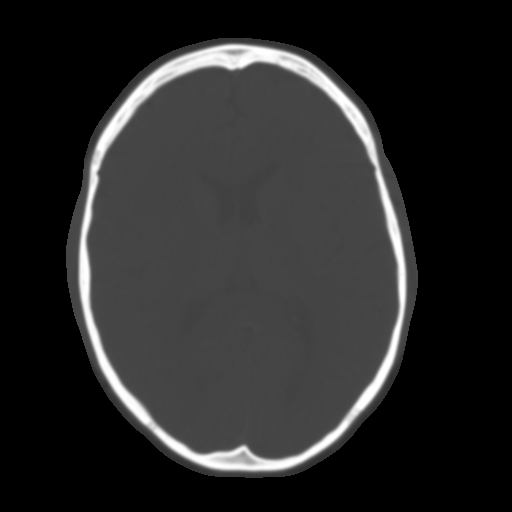
[im 19/30  brain]
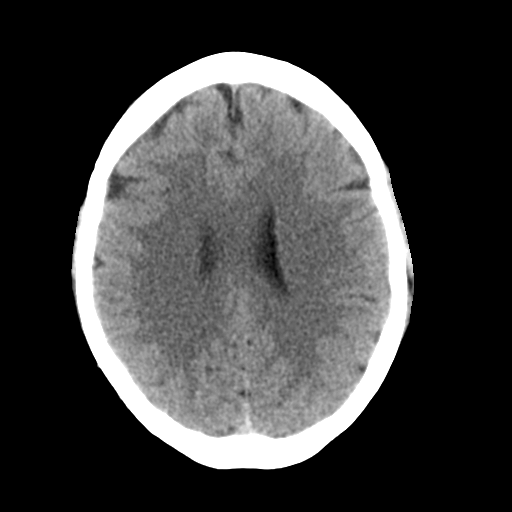
[im 22/30  brain]
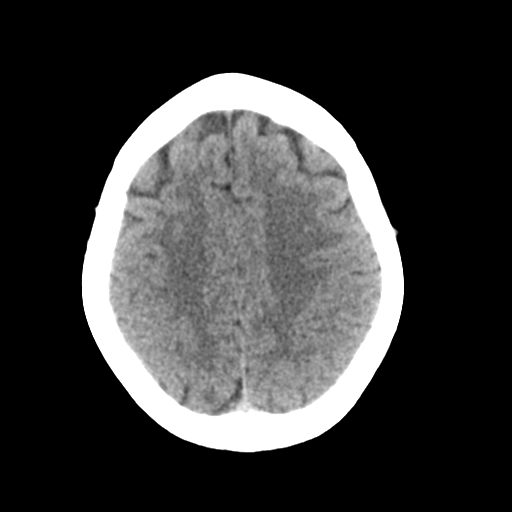
[im 25/30  brain]
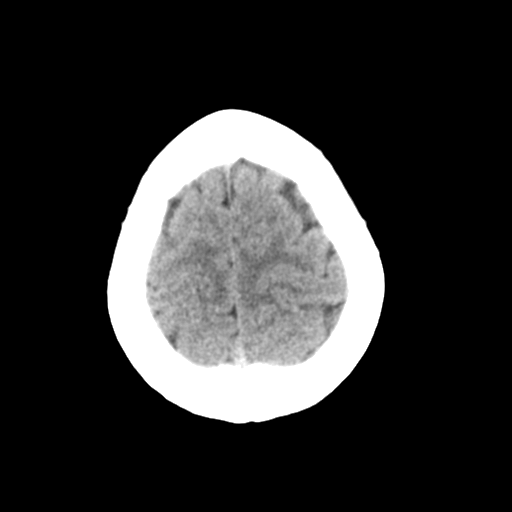
[im 28/30  brain]
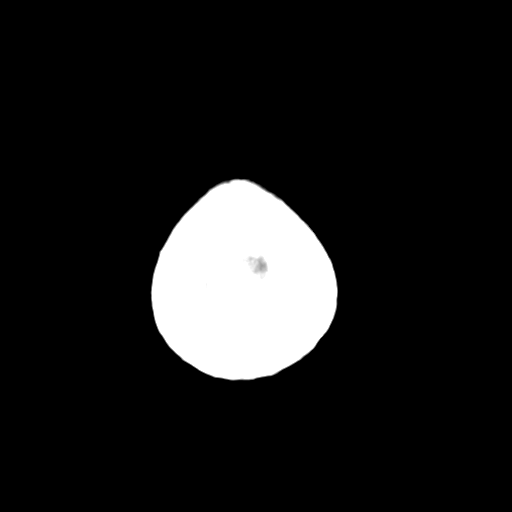
[im 28/30  bone]
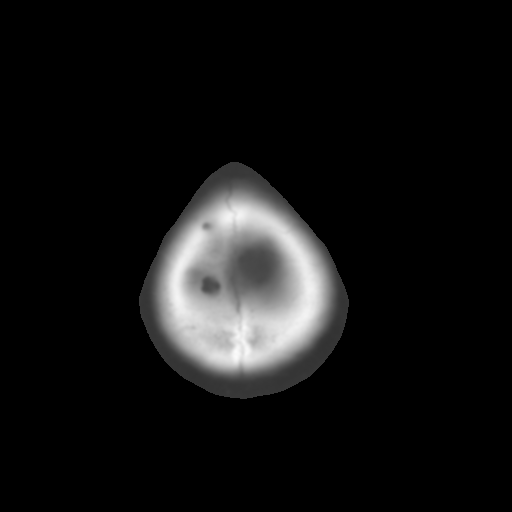

[Series 5: head 3.0 mpr cor · coronal · 0.29mm/px · 3 of 67 slices shown]
[im 23/67  brain]
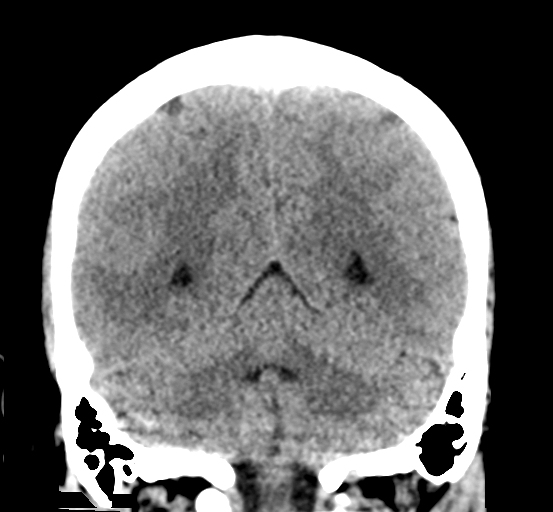
[im 30/67  brain]
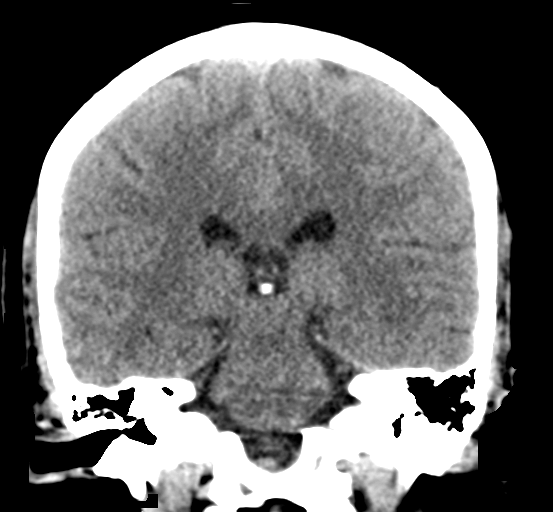
[im 37/67  brain]
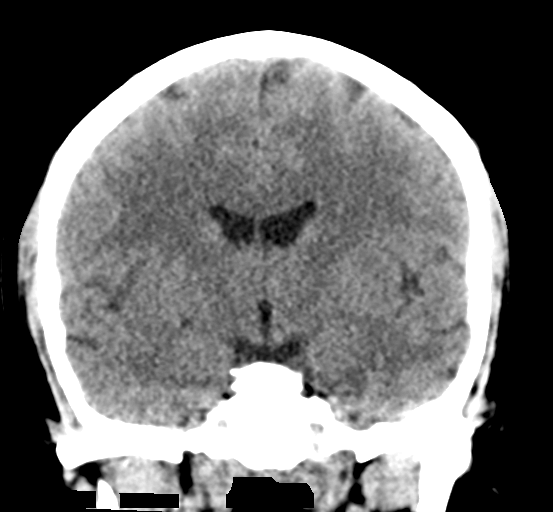

[Series 6: head 3.0 mpr sag · sagittal · 0.29mm/px · 3 of 60 slices shown]
[im 20/60  brain]
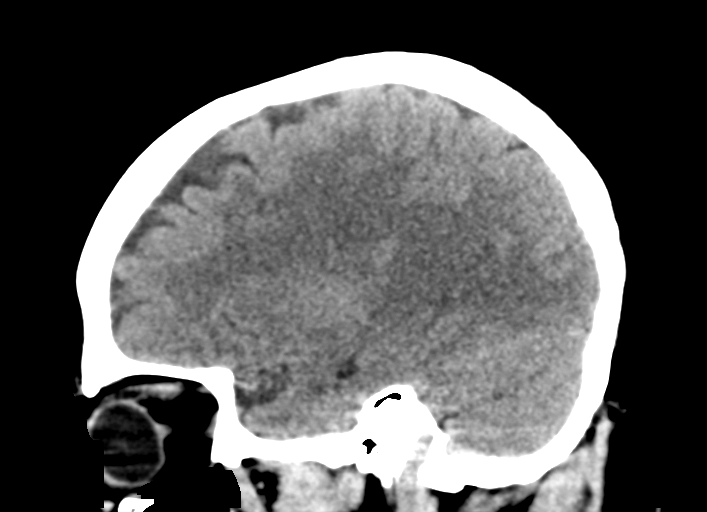
[im 30/60  brain]
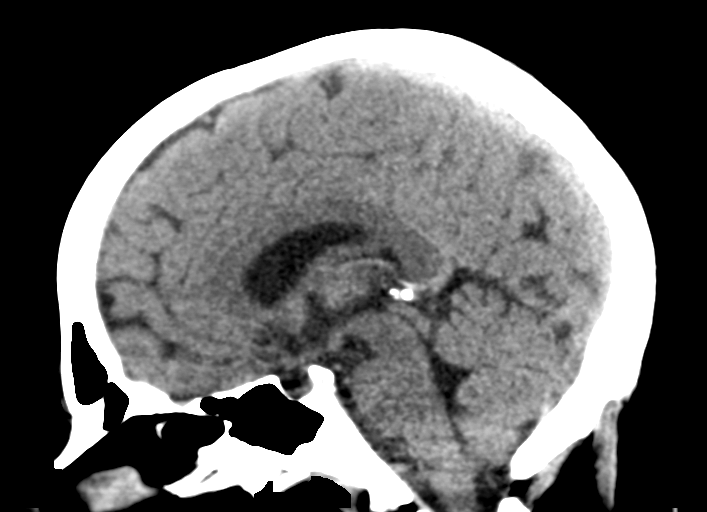
[im 40/60  brain]
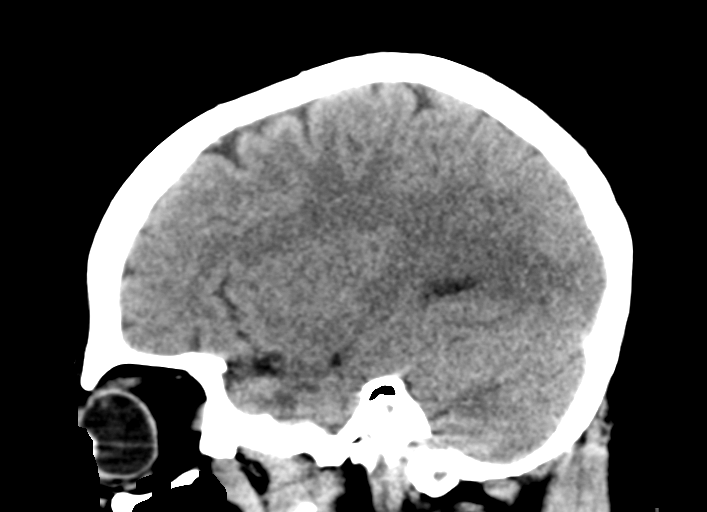

[15 of 47 positions shown; findings below may reference images not displayed]

FINDINGS: Brain: Ventricles and sulci are appropriate for patient's age. No
evidence for acute cortically based infarct, intracranial
hemorrhage, mass lesion or mass-effect.

Vascular: Unremarkable

Skull: Intact.

Sinuses/Orbits: Paranasal sinuses are well aerated. Orbits are
unremarkable.

Other: None.
IMPRESSION: No acute intracranial process.

## 2019-11-13 ENCOUNTER — Other Ambulatory Visit: Payer: Self-pay

## 2019-11-13 ENCOUNTER — Encounter: Payer: Self-pay | Admitting: Family Medicine

## 2019-11-13 ENCOUNTER — Ambulatory Visit: Payer: Medicaid Other | Attending: Family Medicine | Admitting: Family Medicine

## 2019-11-13 VITALS — BP 109/74 | HR 80 | Wt 201.0 lb

## 2019-11-13 DIAGNOSIS — Z79899 Other long term (current) drug therapy: Secondary | ICD-10-CM | POA: Insufficient documentation

## 2019-11-13 DIAGNOSIS — G2581 Restless legs syndrome: Secondary | ICD-10-CM

## 2019-11-13 DIAGNOSIS — I10 Essential (primary) hypertension: Secondary | ICD-10-CM | POA: Diagnosis not present

## 2019-11-13 DIAGNOSIS — Z7901 Long term (current) use of anticoagulants: Secondary | ICD-10-CM | POA: Insufficient documentation

## 2019-11-13 DIAGNOSIS — Z9221 Personal history of antineoplastic chemotherapy: Secondary | ICD-10-CM | POA: Diagnosis not present

## 2019-11-13 DIAGNOSIS — G620 Drug-induced polyneuropathy: Secondary | ICD-10-CM | POA: Diagnosis not present

## 2019-11-13 DIAGNOSIS — T451X5A Adverse effect of antineoplastic and immunosuppressive drugs, initial encounter: Secondary | ICD-10-CM

## 2019-11-13 MED ORDER — PRAMIPEXOLE DIHYDROCHLORIDE 0.5 MG PO TABS: 0.5000 mg | ORAL_TABLET | Freq: Every day | ORAL | 3 refills | Status: AC

## 2019-11-13 MED ORDER — PREGABALIN 25 MG PO CAPS: 25.0000 mg | ORAL_CAPSULE | Freq: Two times a day (BID) | ORAL | 1 refills | Status: DC

## 2019-11-13 NOTE — Progress Notes (Signed)
NEUROPATHY IN HANDS AND FEET

## 2019-11-13 NOTE — Progress Notes (Signed)
Established Patient Office Visit  Subjective:  Patient ID: Rachel Wilkerson, female    DOB: Sep 19, 1985  Age: 34 y.o. MRN: [id number]  CC:  Chief Complaint  Patient presents with   Follow-up    RESTLESS LEG SYNDROME    HPI Rachel Wilkerson, 34 yo female, who is seen in follow-up of visit on 09/29/2019 with Dr. Joya Gaskins in follow-up of chronic medical issues.  On review of recent note, patient's blood pressure was not controlled on atenolol 50 mg and losartan-HCTZ, 100-12.5 milligrams was added to help control blood pressure.  Patient additionally states that she was started on a medication to help with restless leg syndrome and she was told that she was being started on a low dose of the medicine but she reports that she has had no improvement in her restless leg symptoms.  She additionally has a history of Hodgkin's lymphoma for which she was treated with chemotherapy which resulted in neuropathy in her hands and feet.  She continues to have neuropathic pain but states that she is currently in a methadone treatment program due to prior opioid addiction and she has been told that she is not allowed to take gabapentin along with her methadone unless she has permission from her treatment program.  She wonders if there is other medicine other than gabapentin that can be prescribed for her peripheral neuropathy.  Past Medical History:  Diagnosis Date   Anxiety    Arthritis    Back pain    Cancer ([place])    hodkings lymphoma [id number]   Complication of anesthesia    "a little slow to awaken"   Depression    Family history of adverse reaction to anesthesia    "had a high fever" patient not sure if it was Malignat  Hyperthhermia.  Patient has never had a problem   GERD (gastroesophageal reflux disease)    Hypertension    Osteoporosis    Osteogramona   Restless leg syndrome     Past Surgical History:  Procedure Laterality Date   BACK SURGERY  2012   Disectomy   BREAST SURGERY   2014   Breast reduction   DILATION AND CURETTAGE OF UTERUS  2003   INCISION AND DRAINAGE ABSCESS  10/01/2019   Procedure: INCISION AND DRAINAGE LEFT MANDIBLE ABSCESS;  Surgeon: Diona Browner, DDS;  Location: [facility];  Service: Oral Surgery;;   LEG SURGERY Bilateral 2002, 2011, 2012   calcium removed   TOOTH EXTRACTION N/A 10/01/2019   Procedure: DENTAL  EXTRACTIONS TEETH NUMBER SIX, SEVEN, EIGHT, FIFTEEN, EIGHTEEN, NINETEEN, THIRTY, THIRTY-ONE;  Surgeon: Diona Browner, DDS;  Location: [facility];  Service: Oral Surgery;  Laterality: N/A;    Family History  Problem Relation Age of Onset   Diabetes Mother    Schizophrenia Mother    Bipolar disorder Mother    Dementia Mother    Hypertension Father    Non-Hodgkin's lymphoma Paternal Grandfather    Heart attack Paternal Grandfather     Social History   Socioeconomic History   Marital status: Married    Spouse name: Not on file   Number of children: Not on file   Years of education: Not on file   Highest education level: Not on file  Occupational History   Not on file  Tobacco Use   Smoking status: Never Smoker   Smokeless tobacco: Never Used  Vaping Use   Vaping Use: Never used  Substance and Sexual Activity   Alcohol use: Not Currently  Drug use: Never    Comment: 09/30/19 5 months clean of Heroin    Sexual activity: Not on file  Other Topics Concern   Not on file  Social History Narrative   Not on file   Social Determinants of Health   Financial Resource Strain:    Difficulty of Paying Living Expenses: Not on file  Food Insecurity:    Worried About [place] in the Last Year: Not on file   Ran Out of Food in the Last Year: Not on file  Transportation Needs:    Lack of Transportation (Medical): Not on file   Lack of Transportation (Non-Medical): Not on file  Physical Activity:    Days of Exercise per Week: Not on file   Minutes of Exercise per Session: Not on file  Stress:     Feeling of Stress : Not on file  Social Connections:    Frequency of Communication with Friends and Family: Not on file   Frequency of Social Gatherings with Friends and Family: Not on file   Attends Religious Services: Not on file   Active Member of Clubs or Organizations: Not on file   Attends Archivist Meetings: Not on file   Marital Status: Not on file  Intimate Partner Violence:    Fear of Current or Ex-Partner: Not on file   Emotionally Abused: Not on file   Physically Abused: Not on file   Sexually Abused: Not on file    Outpatient Medications Prior to Visit  Medication Sig Dispense Refill   amoxicillin-clavulanate (AUGMENTIN) 875-125 MG tablet Take 1 tablet by mouth 2 (two) times daily. 20 tablet 0   atenolol (TENORMIN) 50 MG tablet TAKE 1 TABLET BY MOUTH EVERY DAY 30 tablet 0   ibuprofen (ADVIL) 800 MG tablet Take 1 tablet (800 mg total) by mouth every 8 (eight) hours as needed. 30 tablet 0   losartan-hydrochlorothiazide (HYZAAR) 100-12.5 MG tablet Take 1 tablet by mouth daily. 90 tablet 3   magnesium oxide (MAG-OX) 400 MG tablet Take 400 mg by mouth daily.     Melatonin 10 MG TABS Take 10 mg by mouth at bedtime.     methadone (DOLOPHINE) 0.4 mg/mL SOLN Take 100 mg by mouth daily.     naloxone (NARCAN) nasal spray 4 mg/0.1 mL Place 1 spray into the nose as needed (Over dose).      omeprazole (PRILOSEC) 40 MG capsule Take 40 mg by mouth in the morning and at bedtime.     oxyCODONE (OXY IR/ROXICODONE) 5 MG immediate release tablet Take 1 tablet (5 mg total) by mouth every 4 (four) hours as needed. 30 tablet 0   pramipexole (MIRAPEX) 0.25 MG tablet Take 1 tablet (0.25 mg total) by mouth at bedtime. 30 tablet 2   prochlorperazine (COMPAZINE) 5 MG tablet TAKE 1 TABLET (5 MG TOTAL) BY MOUTH EVERY 6 (SIX) HOURS AS NEEDED FOR NAUSEA OR VOMITING. 30 tablet 2   Venlafaxine HCl 225 MG TB24 Take 225 mg by mouth daily.     No facility-administered  medications prior to visit.    Allergies  Allergen Reactions   Erythromycin Hives   Tramadol Hives    ROS Review of Systems  Constitutional: Positive for fatigue. Negative for chills and fever.  HENT: Negative for sore throat and trouble swallowing.   Respiratory: Negative for cough and shortness of breath.   Cardiovascular: Negative for chest pain and palpitations.  Gastrointestinal: Negative for abdominal pain, constipation, diarrhea and nausea.  Endocrine: Negative for polydipsia, polyphagia and polyuria.  Genitourinary: Negative for dysuria and frequency.  Musculoskeletal: Positive for arthralgias. Negative for gait problem.  Neurological: Positive for numbness. Negative for headaches.  Hematological: Negative for adenopathy. Does not bruise/bleed easily.  Psychiatric/Behavioral: Positive for sleep disturbance. Negative for suicidal ideas. The patient is nervous/anxious.       Objective:    Physical Exam Vitals and nursing note reviewed.  Constitutional:      General: She is not in acute distress.    Appearance: Normal appearance. She is obese.  Cardiovascular:     Rate and Rhythm: Normal rate and regular rhythm.  Pulmonary:     Effort: Pulmonary effort is normal.     Breath sounds: Normal breath sounds.  Abdominal:     Palpations: Abdomen is soft.     Tenderness: There is no abdominal tenderness. There is no guarding or rebound.  Musculoskeletal:     Right lower leg: No edema.     Left lower leg: No edema.  Skin:    General: Skin is warm and dry.  Neurological:     Mental Status: She is alert and oriented to person, place, and time. Mental status is at baseline.  Psychiatric:        Mood and Affect: Mood normal.        Behavior: Behavior normal.     BP 109/74 (BP Location: Left Arm, Patient Position: Sitting)    Pulse 80    Wt 201 lb (91.2 kg)    SpO2 98%    BMI 34.50 kg/m  Wt Readings from Last 3 Encounters:  11/13/19 201 lb (91.2 kg)  09/29/19 201 lb  6.4 oz (91.4 kg)  04/22/19 202 lb (91.6 kg)     Health Maintenance Due  Topic Date Due   PAP SMEAR-Modifier  Never done    No results found for: TSH Lab Results  Component Value Date   WBC 12.3 (H) 09/29/2019   HGB 12.3 09/29/2019   HCT 35.1 09/29/2019   MCV 82 09/29/2019   PLT 347 09/29/2019   Lab Results  Component Value Date   NA 138 09/29/2019   K 5.1 09/29/2019   CO2 23 09/29/2019   GLUCOSE 102 (H) 09/29/2019   BUN 11 09/29/2019   CREATININE 0.92 09/29/2019   BILITOT 0.3 09/29/2019   ALKPHOS 97 09/29/2019   AST 19 09/29/2019   ALT 16 09/29/2019   PROT 8.5 09/29/2019   ALBUMIN 4.5 09/29/2019   CALCIUM 9.7 09/29/2019   ANIONGAP 13 06/18/2018   No results found for: CHOL No results found for: HDL No results found for: LDLCALC No results found for: TRIG No results found for: CHOLHDL No results found for: HGBA1C    Assessment & Plan:  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  She did try gabapentin in the past but states that it became ineffective and she was on the highest possible doses.  She is interested in medication which may help with her peripheral neuropathy.  Discussed treatment with Lyrica which she is a controlled substance.  Patient was provided with a printed prescription to start Lyrica at the lowest dose of 25 mg twice daily to see if she tolerates the medication.  Prescription was given in printed form as patient states that she will need to get clearance from the methadone clinic in order to start  the use of the medication.  She is aware that the medication can be titrated to a higher dose if she is allowed to start the use of the medication. - pregabalin (LYRICA) 25 MG capsule; Take 1 capsule (25 mg total) by mouth 2 (two) times daily.  Dispense: 60 capsule; Refill: 1  2. Restless  leg Patient reports that she was recently started on Mirapex 0.25 mg at bedtime for treatment of restless leg syndrome however she has noted no improvement in symptoms on this dose.  We will increase patient's dose to 0.5 mg to try at bedtime to see if this is helpful.  Another option may be to switch patient to Requip if Mirapex continues to be ineffective. - pramipexole (MIRAPEX) 0.5 MG tablet; Take 1 tablet (0.5 mg total) by mouth at bedtime.  Dispense: 30 tablet; Refill: 3  3. Essential hypertension At patient's last visit, her blood pressure was elevated with the use of atenolol 50 mg daily and she was also prescribed losartan-hydrochlorothiazide 100-12.5 mg.  At today's visit, blood pressure is well controlled.  She will continue her current medication regimen.    Follow-up: Return in about 2 months (around 01/13/2020) for neuropathy; reschedule for pap.   Antony Blackbird, MD

## 2019-11-14 ENCOUNTER — Other Ambulatory Visit: Payer: Self-pay | Admitting: Family Medicine

## 2019-11-14 DIAGNOSIS — I10 Essential (primary) hypertension: Secondary | ICD-10-CM

## 2019-11-21 ENCOUNTER — Ambulatory Visit: Payer: Medicaid Other | Admitting: Family Medicine

## 2019-11-24 ENCOUNTER — Ambulatory Visit (HOSPITAL_BASED_OUTPATIENT_CLINIC_OR_DEPARTMENT_OTHER): Payer: Medicaid Other | Attending: Critical Care Medicine | Admitting: Internal Medicine

## 2019-11-24 ENCOUNTER — Other Ambulatory Visit: Payer: Self-pay

## 2019-11-24 VITALS — Ht 64.0 in | Wt 206.0 lb

## 2019-11-24 DIAGNOSIS — G2581 Restless legs syndrome: Secondary | ICD-10-CM | POA: Diagnosis not present

## 2019-11-24 DIAGNOSIS — F5101 Primary insomnia: Secondary | ICD-10-CM

## 2019-11-24 DIAGNOSIS — G4733 Obstructive sleep apnea (adult) (pediatric): Secondary | ICD-10-CM | POA: Insufficient documentation

## 2019-12-06 ENCOUNTER — Other Ambulatory Visit: Payer: Self-pay | Admitting: Family Medicine

## 2019-12-06 DIAGNOSIS — I10 Essential (primary) hypertension: Secondary | ICD-10-CM

## 2019-12-06 DIAGNOSIS — G2581 Restless legs syndrome: Secondary | ICD-10-CM

## 2019-12-06 DIAGNOSIS — G4733 Obstructive sleep apnea (adult) (pediatric): Secondary | ICD-10-CM

## 2019-12-06 NOTE — Procedures (Signed)
   Patient Name: Rachel Wilkerson, Rachel Wilkerson Date: 11/24/2019 Gender: Female D.O.B: 1985/01/12 Age (years): 34 Referring Provider: Asencion Noble Height (inches): 47 Interpreting Physician: Baird Lyons MD, ABSM Weight (lbs): 206 RPSGT: Jacolyn Reedy BMI: 35 MRN: [id number] Neck Size: 17.00  CLINICAL INFORMATION Sleep Study Type: HST Indication for sleep study: OSA Epworth Sleepiness Score: 12  SLEEP STUDY TECHNIQUE A multi-channel overnight portable sleep study was performed. The channels recorded were: nasal airflow, thoracic respiratory movement, and oxygen saturation with a pulse oximetry. Snoring was also monitored.  MEDICATIONS Patient self administered medications include: none reported.  SLEEP ARCHITECTURE Patient was studied for 507.5 minutes. The sleep efficiency was 100.0 % and the patient was supine for 0%. The arousal index was 0.0 per hour.  RESPIRATORY PARAMETERS The overall AHI was 51.4 per hour, with a central apnea index of 0.1 per hour. The oxygen nadir was 73% during sleep.  CARDIAC DATA Mean heart rate during sleep was 86.8 bpm.  IMPRESSIONS - Severe obstructive sleep apnea occurred during this study (AHI = 51.4/h). - No significant central sleep apnea occurred during this study (CAI = 0.1/h). - Oxygen desaturation was noted during this study (Min O2 = 73%). Mean O2 sat 94%. - Patient snored.  DIAGNOSIS - Obstructive Sleep Apnea (G47.33)  RECOMMENDATIONS - Sugest CPAP titration sleep study or autopap.Other options would be based on clinical judgment. - Be careful with alcohol, sedatives and other CNS depressants that may worsen sleep apnea and disrupt normal sleep architecture. - Sleep hygiene should be reviewed to assess factors that may improve sleep quality. - Weight management and regular exercise should be initiated or continued.  [Electronically signed] 12/06/2019 12:22 PM  Baird Lyons MD, [place], American Board of Sleep  Medicine   NPI: [id number]                         [place], [place] [facility]  ELECTRONICALLY SIGNED ON:  12/06/2019, 12:19 PM [place] PH: [phone]   FX: [phone] [place]

## 2019-12-06 NOTE — Telephone Encounter (Signed)
Requested Prescriptions  Pending Prescriptions Disp Refills  . atenolol (TENORMIN) 50 MG tablet [Pharmacy Med Name: ATENOLOL 50 MG TABLET] 90 tablet 1    Sig: TAKE 1 TABLET BY MOUTH EVERY DAY     Cardiovascular:  Beta Blockers Passed - 12/06/2019 12:31 PM      Passed - Last BP in normal range    BP Readings from Last 1 Encounters:  11/13/19 109/74         Passed - Last Heart Rate in normal range    Pulse Readings from Last 1 Encounters:  11/13/19 80         Passed - Valid encounter within last 6 months    Recent Outpatient Visits          3 weeks ago Peripheral neuropathy due to chemotherapy [facility])   [place] Fulp, [place], MD   2 months ago Need for influenza vaccination   [place], Jarome Matin, RPH-CPP   2 months ago 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

## 2019-12-09 ENCOUNTER — Telehealth: Payer: Self-pay

## 2019-12-09 ENCOUNTER — Telehealth: Payer: Self-pay | Admitting: Critical Care Medicine

## 2019-12-09 DIAGNOSIS — G4733 Obstructive sleep apnea (adult) (pediatric): Secondary | ICD-10-CM | POA: Insufficient documentation

## 2019-12-09 NOTE — Telephone Encounter (Signed)
Call placed to patient to inquire if she has a preference for DME companies for her CPAP machine.  She had no preference and was in agreement to sending referral to [facility].  Referral then faxed to [facility].

## 2019-12-09 NOTE — Telephone Encounter (Signed)
I spoke to the patient re home sleep study:  Positive for severe sleep apnea  AHI > 50 and desats to 70% range.  I will order cpap and also inlab cpap titration study

## 2020-01-12 ENCOUNTER — Telehealth: Payer: Self-pay

## 2020-01-12 NOTE — Telephone Encounter (Signed)
Call placed to [facility] regarding status of CPAP order.  Spoke to [place] who stated that they are just waiting for CPAP delivery .  There has been a Health visitor

## 2020-01-14 ENCOUNTER — Ambulatory Visit: Payer: Medicaid Other | Admitting: Critical Care Medicine

## 2020-01-14 ENCOUNTER — Ambulatory Visit: Payer: Medicaid Other | Admitting: Family Medicine

## 2020-02-03 ENCOUNTER — Other Ambulatory Visit: Payer: Self-pay | Admitting: Family Medicine

## 2020-02-03 DIAGNOSIS — T451X5A Adverse effect of antineoplastic and immunosuppressive drugs, initial encounter: Secondary | ICD-10-CM

## 2020-02-03 DIAGNOSIS — G620 Drug-induced polyneuropathy: Secondary | ICD-10-CM

## 2020-02-03 NOTE — Telephone Encounter (Signed)
Non delegated refill request for Lyrica. 

## 2020-02-03 NOTE — Telephone Encounter (Signed)
Medication Refill - Medication: Lyrica 25mg   Has the patient contacted their pharmacy? Yes.   (Agent: If no, request that the patient contact the pharmacy for the refill.) (Agent: If yes, when and what did the pharmacy advise?)  Preferred Pharmacy (with phone number or street name): [facility] [id number] [place]: Please be advised that RX refills may take up to 3 business days. We ask that you follow-up with your pharmacy.

## 2020-02-06 ENCOUNTER — Telehealth: Payer: Self-pay

## 2020-02-06 MED ORDER — PREGABALIN 25 MG PO CAPS: 25.0000 mg | ORAL_CAPSULE | Freq: Two times a day (BID) | ORAL | 1 refills | Status: AC

## 2020-02-06 NOTE — Telephone Encounter (Signed)
Call placed to [facility] to check on status of CPAP referral.  Spoke to T[place] who confirmed that they have the order and are still waiting for CPAP machines to be delivered

## 2020-02-07 ENCOUNTER — Encounter (HOSPITAL_BASED_OUTPATIENT_CLINIC_OR_DEPARTMENT_OTHER): Payer: Medicaid Other | Admitting: Internal Medicine

## 2020-04-28 ENCOUNTER — Telehealth: Payer: Self-pay

## 2020-04-28 NOTE — Telephone Encounter (Signed)
Call placed to [facility] to inquire if patient ever received her CPAP. Spoke to [place] who said that they spoke to the patient 03/29/2020 to schedule CPAP delivery and the patient said to cancel the order. Her husband passed away and she had to move back to [place] and she is now uninsured.
